# Patient Record
Sex: Female | Born: 1965 | Race: White | Hispanic: No | Marital: Single | Smoking: Never smoker
Health system: Southern US, Community
[De-identification: ages and names within clinical notes are randomized; demographics above are authoritative.]

## PROBLEM LIST (undated history)

## (undated) DIAGNOSIS — K219 Gastro-esophageal reflux disease without esophagitis: Secondary | ICD-10-CM

## (undated) DIAGNOSIS — M1990 Unspecified osteoarthritis, unspecified site: Secondary | ICD-10-CM

## (undated) DIAGNOSIS — Z973 Presence of spectacles and contact lenses: Secondary | ICD-10-CM

## (undated) DIAGNOSIS — R42 Dizziness and giddiness: Secondary | ICD-10-CM

## (undated) DIAGNOSIS — N879 Dysplasia of cervix uteri, unspecified: Secondary | ICD-10-CM

## (undated) DIAGNOSIS — Z889 Allergy status to unspecified drugs, medicaments and biological substances status: Secondary | ICD-10-CM

## (undated) DIAGNOSIS — B019 Varicella without complication: Secondary | ICD-10-CM

## (undated) DIAGNOSIS — M5090 Cervical disc disorder, unspecified, unspecified cervical region: Secondary | ICD-10-CM

## (undated) DIAGNOSIS — H33312 Horseshoe tear of retina without detachment, left eye: Secondary | ICD-10-CM

## (undated) HISTORY — DX: Varicella without complication: B01.9

## (undated) HISTORY — PX: COLONOSCOPY: SHX174

## (undated) HISTORY — PX: LIPOMA EXCISION: SHX5283

## (undated) HISTORY — DX: Allergy status to unspecified drugs, medicaments and biological substances: Z88.9

---

## 1999-02-01 ENCOUNTER — Other Ambulatory Visit: Admission: RE | Admit: 1999-02-01 | Discharge: 1999-02-01 | Payer: Self-pay | Admitting: Obstetrics and Gynecology

## 2000-03-21 ENCOUNTER — Other Ambulatory Visit: Admission: RE | Admit: 2000-03-21 | Discharge: 2000-03-21 | Payer: Self-pay | Admitting: Obstetrics and Gynecology

## 2002-04-20 ENCOUNTER — Other Ambulatory Visit: Admission: RE | Admit: 2002-04-20 | Discharge: 2002-04-20 | Payer: Self-pay

## 2003-04-29 ENCOUNTER — Other Ambulatory Visit: Admission: RE | Admit: 2003-04-29 | Discharge: 2003-04-29 | Payer: Self-pay | Admitting: Obstetrics and Gynecology

## 2004-05-02 ENCOUNTER — Other Ambulatory Visit: Admission: RE | Admit: 2004-05-02 | Discharge: 2004-05-02 | Payer: Self-pay | Admitting: Obstetrics and Gynecology

## 2004-05-09 ENCOUNTER — Encounter: Admission: RE | Admit: 2004-05-09 | Discharge: 2004-05-09 | Payer: Self-pay | Admitting: Obstetrics and Gynecology

## 2005-05-17 ENCOUNTER — Encounter: Admission: RE | Admit: 2005-05-17 | Discharge: 2005-05-17 | Payer: Self-pay | Admitting: Obstetrics and Gynecology

## 2005-05-22 ENCOUNTER — Other Ambulatory Visit: Admission: RE | Admit: 2005-05-22 | Discharge: 2005-05-22 | Payer: Self-pay | Admitting: Obstetrics and Gynecology

## 2005-12-24 ENCOUNTER — Encounter (INDEPENDENT_AMBULATORY_CARE_PROVIDER_SITE_OTHER): Payer: Self-pay | Admitting: Specialist

## 2005-12-24 ENCOUNTER — Ambulatory Visit (HOSPITAL_BASED_OUTPATIENT_CLINIC_OR_DEPARTMENT_OTHER): Admission: RE | Admit: 2005-12-24 | Discharge: 2005-12-24 | Payer: Self-pay | Admitting: General Surgery

## 2006-05-20 ENCOUNTER — Encounter: Admission: RE | Admit: 2006-05-20 | Discharge: 2006-05-20 | Payer: Self-pay | Admitting: Obstetrics and Gynecology

## 2006-05-23 ENCOUNTER — Other Ambulatory Visit: Admission: RE | Admit: 2006-05-23 | Discharge: 2006-05-23 | Payer: Self-pay | Admitting: Obstetrics and Gynecology

## 2007-05-27 ENCOUNTER — Other Ambulatory Visit: Admission: RE | Admit: 2007-05-27 | Discharge: 2007-05-27 | Payer: Self-pay | Admitting: Obstetrics and Gynecology

## 2007-05-27 ENCOUNTER — Encounter: Admission: RE | Admit: 2007-05-27 | Discharge: 2007-05-27 | Payer: Self-pay | Admitting: Obstetrics and Gynecology

## 2007-11-19 ENCOUNTER — Other Ambulatory Visit: Admission: RE | Admit: 2007-11-19 | Discharge: 2007-11-19 | Payer: Self-pay | Admitting: Obstetrics and Gynecology

## 2008-06-07 ENCOUNTER — Encounter: Admission: RE | Admit: 2008-06-07 | Discharge: 2008-06-07 | Payer: Self-pay | Admitting: Obstetrics and Gynecology

## 2008-06-07 ENCOUNTER — Other Ambulatory Visit: Admission: RE | Admit: 2008-06-07 | Discharge: 2008-06-07 | Payer: Self-pay | Admitting: Obstetrics and Gynecology

## 2009-06-08 ENCOUNTER — Other Ambulatory Visit: Admission: RE | Admit: 2009-06-08 | Discharge: 2009-06-08 | Payer: Self-pay | Admitting: Obstetrics and Gynecology

## 2009-06-09 ENCOUNTER — Encounter: Admission: RE | Admit: 2009-06-09 | Discharge: 2009-06-09 | Payer: Self-pay | Admitting: Obstetrics and Gynecology

## 2010-06-12 ENCOUNTER — Encounter: Admission: RE | Admit: 2010-06-12 | Discharge: 2010-06-12 | Payer: Self-pay | Admitting: Obstetrics and Gynecology

## 2010-06-20 ENCOUNTER — Other Ambulatory Visit: Admission: RE | Admit: 2010-06-20 | Discharge: 2010-06-20 | Payer: Self-pay | Admitting: Obstetrics and Gynecology

## 2010-12-08 NOTE — Op Note (Signed)
NAMEJERILYNN, Karen Navarro                ACCOUNT NO.:  [id number]   MEDICAL RECORD NO.:  [id number]          PATIENT TYPE:  AMB   LOCATION:  DSC                          FACILITY:  [facility]   PHYSICIAN:  Gabrielle Dare. Janee Morn, M.D.DATE OF BIRTH:  1966-01-28   DATE OF PROCEDURE:  12/24/2005  DATE OF DISCHARGE:                                 OPERATIVE REPORT   PREOPERATIVE DIAGNOSIS:  Mass right lower back.   POSTOPERATIVE DIAGNOSIS:  Mass right lower back.   PROCEDURE:  Excision mass right lower back.   SURGEON:  Violeta Gelinas.   ANESTHESIA:  General.   HISTORY OF PRESENT ILLNESS:  The patient is a 45 year old female who I  evaluated in the office for a mass in her right lower back.  It has been  gradually getting larger and she presents for elective excision.   PROCEDURE IN DETAIL:  Informed consent was obtained the patient received  intravenous antibiotics.  She was identified in the preop holding area; and  her site was marked where we had agreed upon its location.  It is easily  visible.  She was then brought to the operating room.  Conscious sedation  was administered, her back was prepped and draped in a sterile fashion.  After she was placed in the prone position, the table was flexed to more  easily visualize the mass.  The area was infiltrated with local anesthetic.  Then 1/4% Marcaine with epinephrine mixed with 1% lidocaine.  Once an  adequate local block was obtained, a transverse incision was made overlying  the mass.  The subcutaneous tissues were dissected down revealing the mass.  It was a circular encapsulated mass of adipose tissue; was well  encapsulated, but was quite adherent to the underlying fascia.  It was  widely excised, in 1 piece and sent to pathology.  The wound was copiously  irrigated, meticulous hemostasis was obtained.  Some additional local  anesthetic was injected.  The wound was then closed in layers with deep  tissues approximated with interrupted 3-0  Vicryl sutures; and the skin  closed with a running 4-0 Monocryl subcuticular stitch.  Sponge, needle, and  instrument counts were correct.  Benzoin, Steri-Strips, and sterile  dressings were applied.  The patient tolerated the procedure well without  apparent complication; and was taken to the recovery room in stable  condition.      Gabrielle Dare Janee Morn, M.D.  Electronically Signed     BET/MEDQ  D:  12/24/2005  T:  12/25/2005  Job:  [id number]   cc:   [id number] Elmon Else

## 2011-02-21 ENCOUNTER — Encounter: Payer: Self-pay | Admitting: Podiatry

## 2011-05-07 ENCOUNTER — Other Ambulatory Visit: Payer: Self-pay | Admitting: Obstetrics and Gynecology

## 2011-05-07 DIAGNOSIS — Z1231 Encounter for screening mammogram for malignant neoplasm of breast: Secondary | ICD-10-CM

## 2011-06-19 ENCOUNTER — Ambulatory Visit
Admission: RE | Admit: 2011-06-19 | Discharge: 2011-06-19 | Disposition: A | Discharge status: Home / Self Care | Payer: BC Managed Care – PPO | Source: Ambulatory Visit | Attending: Obstetrics and Gynecology | Admitting: Obstetrics and Gynecology

## 2011-06-19 DIAGNOSIS — Z1231 Encounter for screening mammogram for malignant neoplasm of breast: Secondary | ICD-10-CM

## 2011-06-26 ENCOUNTER — Other Ambulatory Visit (HOSPITAL_COMMUNITY)
Admission: RE | Admit: 2011-06-26 | Discharge: 2011-06-26 | Disposition: A | Discharge status: Home / Self Care | Payer: BC Managed Care – PPO | Source: Ambulatory Visit | Attending: Obstetrics and Gynecology | Admitting: Obstetrics and Gynecology

## 2011-06-26 ENCOUNTER — Other Ambulatory Visit: Payer: Self-pay | Admitting: Obstetrics and Gynecology

## 2011-06-26 DIAGNOSIS — Z01419 Encounter for gynecological examination (general) (routine) without abnormal findings: Secondary | ICD-10-CM | POA: Insufficient documentation

## 2012-05-12 ENCOUNTER — Other Ambulatory Visit: Payer: Self-pay | Admitting: Obstetrics and Gynecology

## 2012-05-12 DIAGNOSIS — Z1231 Encounter for screening mammogram for malignant neoplasm of breast: Secondary | ICD-10-CM

## 2012-06-23 ENCOUNTER — Ambulatory Visit
Admission: RE | Admit: 2012-06-23 | Discharge: 2012-06-23 | Disposition: A | Discharge status: Home / Self Care | Payer: BC Managed Care – PPO | Source: Ambulatory Visit | Attending: Obstetrics and Gynecology | Admitting: Obstetrics and Gynecology

## 2012-06-23 ENCOUNTER — Ambulatory Visit: Payer: BC Managed Care – PPO

## 2012-06-23 DIAGNOSIS — Z1231 Encounter for screening mammogram for malignant neoplasm of breast: Secondary | ICD-10-CM

## 2012-07-09 ENCOUNTER — Other Ambulatory Visit: Payer: Self-pay | Admitting: Obstetrics and Gynecology

## 2012-07-09 ENCOUNTER — Other Ambulatory Visit (HOSPITAL_COMMUNITY)
Admission: RE | Admit: 2012-07-09 | Discharge: 2012-07-09 | Disposition: A | Discharge status: Home / Self Care | Payer: BC Managed Care – PPO | Source: Ambulatory Visit | Attending: Obstetrics and Gynecology | Admitting: Obstetrics and Gynecology

## 2012-07-09 DIAGNOSIS — Z01419 Encounter for gynecological examination (general) (routine) without abnormal findings: Secondary | ICD-10-CM | POA: Insufficient documentation

## 2013-05-18 ENCOUNTER — Other Ambulatory Visit: Payer: Self-pay

## 2013-05-18 DIAGNOSIS — Z1231 Encounter for screening mammogram for malignant neoplasm of breast: Secondary | ICD-10-CM

## 2013-06-24 ENCOUNTER — Ambulatory Visit
Admission: RE | Admit: 2013-06-24 | Discharge: 2013-06-24 | Disposition: A | Discharge status: Home / Self Care | Payer: BC Managed Care – PPO | Source: Ambulatory Visit

## 2013-06-24 DIAGNOSIS — Z1231 Encounter for screening mammogram for malignant neoplasm of breast: Secondary | ICD-10-CM

## 2013-09-10 ENCOUNTER — Other Ambulatory Visit (HOSPITAL_COMMUNITY)
Admission: RE | Admit: 2013-09-10 | Discharge: 2013-09-10 | Disposition: A | Discharge status: Home / Self Care | Payer: BC Managed Care – PPO | Source: Ambulatory Visit | Attending: Obstetrics and Gynecology | Admitting: Obstetrics and Gynecology

## 2013-09-10 ENCOUNTER — Other Ambulatory Visit: Payer: Self-pay | Admitting: Obstetrics and Gynecology

## 2013-09-10 DIAGNOSIS — Z1151 Encounter for screening for human papillomavirus (HPV): Secondary | ICD-10-CM | POA: Insufficient documentation

## 2013-09-10 DIAGNOSIS — Z01419 Encounter for gynecological examination (general) (routine) without abnormal findings: Secondary | ICD-10-CM | POA: Insufficient documentation

## 2013-11-20 ENCOUNTER — Encounter: Payer: Self-pay | Admitting: *Deleted

## 2013-11-20 ENCOUNTER — Ambulatory Visit (INDEPENDENT_AMBULATORY_CARE_PROVIDER_SITE_OTHER): Payer: BC Managed Care – PPO | Admitting: Family Medicine

## 2013-11-20 VITALS — BP 122/74 | HR 85 | Wt 161.0 lb

## 2013-11-20 DIAGNOSIS — M940 Chondrocostal junction syndrome [Tietze]: Secondary | ICD-10-CM

## 2013-11-20 DIAGNOSIS — M542 Cervicalgia: Secondary | ICD-10-CM

## 2013-11-20 DIAGNOSIS — M999 Biomechanical lesion, unspecified: Secondary | ICD-10-CM | POA: Insufficient documentation

## 2013-11-20 DIAGNOSIS — M948X9 Other specified disorders of cartilage, unspecified sites: Secondary | ICD-10-CM

## 2013-11-20 DIAGNOSIS — M9981 Other biomechanical lesions of cervical region: Secondary | ICD-10-CM

## 2013-11-20 MED ORDER — CYCLOBENZAPRINE HCL 5 MG PO TABS: 5.0000 mg | ORAL_TABLET | Freq: Three times a day (TID) | ORAL | Status: DC | PRN

## 2013-11-20 MED ORDER — MELOXICAM 15 MG PO TABS: 15.0000 mg | ORAL_TABLET | Freq: Every day | ORAL | Status: DC

## 2013-11-20 MED ORDER — KETOROLAC TROMETHAMINE 60 MG/2ML IM SOLN
60.0000 mg | Freq: Once | INTRAMUSCULAR | Status: AC
  Administered 2013-11-20: 60 mg via INTRAMUSCULAR

## 2013-11-20 NOTE — Patient Instructions (Signed)
Good to see you Exercises 3 times a week  Work on posture, on wall heels, butt, shoulders and head for goal of 5 min Neck exercises 3 times a week.  Ice 20 minutes Meloxicam daily for 10 days  Flexeril when you need it.  See me again in 2 weeks.

## 2013-11-20 NOTE — Addendum Note (Signed)
Addended by: Edwena FeltyARSON, Makeba Delcastillo T on: 11/20/2013 08:41 AM   Modules accepted: Orders

## 2013-11-20 NOTE — Progress Notes (Signed)
  Tawana ScaleZach Smith D.O.  [facility] [address] [place], [place] [address] Phone: [phone] Subjective:     CC: back and neck pain.   OZH:YQMVHQIONGHPI:Subjective Karen RuedKaren B Smothers is a 48 y.o. female coming in with complaint of neck and upper back pain. Patient has had this pain for multiple decades. Patient states that she's tried anti-inflammatories and over-the-counter medications without any significant improvement. Patient states on day-to-day basis she continues to have pain. Denies any radiation down her arms any numbness or tingling. Patient's daughter sees me for manipulation and states that she would like to potentially try this if it is possible. Patient does not read her injury and states that she wakes up multiple times at night because of neck pain. Denies any weakness in the upper trimmings. Patient is a severity a 6/10.     Past medical history, social, surgical and family history all reviewed in electronic medical record.   Review of Systems: No headache, visual changes, nausea, vomiting, diarrhea, constipation, dizziness, abdominal pain, skin rash, fevers, chills, night sweats, weight loss, swollen lymph nodes, body aches, joint swelling, muscle aches, chest pain, shortness of breath, mood changes.   Objective Blood pressure 122/74, pulse 85, weight 161 lb (73.029 kg), SpO2 99.00%.  General: No apparent distress alert and oriented x3 mood and affect normal, dressed appropriately.  HEENT: Pupils equal, extraocular movements intact  Respiratory: Patient's speak in full sentences and does not appear short of breath  Cardiovascular: No lower extremity edema, non tender, no erythema  Skin: Warm dry intact with no signs of infection or rash on extremities or on axial skeleton.  Abdomen: Soft nontender  Neuro: Cranial nerves II through XII are intact, neurovascularly intact in all extremities with 2+ DTRs and 2+ pulses.  Lymph: No lymphadenopathy of posterior or anterior cervical  chain or axillae bilaterally.  Gait normal with good balance and coordination.  MSK:  Non tender with full range of motion and good stability and symmetric strength and tone of shoulders, elbows, wrist, hip, knee and ankles bilaterally.  Neck: Inspection unremarkable. No palpable stepoffs. Negative Spurling's maneuver. Limited range of motion with left-sided rotation and left-sided side bending Grip strength and sensation normal in bilateral hands Strength good C4 to T1 distribution No sensory change to C4 to T1 Negative Hoffman sign bilaterally Reflexes normal OMT Physical Exam  Standing structural       Occiput left high  Shoulder left high  Inferior angle of scapula  Standing flexion right  Seated Flexion right  Cervical  C2 flexed rotated and side bent left C4 flexed rotated inside that right  Thoracic T1 extended rotated and side bent left with elevated first rib T5 extended rotated and side bent left with elevated fifth rib is stuck in inhalation  Lumbar L1 flexed rotated and side bent to right  Sacrum Right on right      Impression and Recommendations:     This case required medical decision making of moderate complexity.

## 2013-11-20 NOTE — Assessment & Plan Note (Signed)
Decision today to treat with OMT was based on Physical Exam  After verbal consent patient was treated with HVLA, ME, FPR techniques in Cervical, thoracic, lumbar and sacral and rib areas  Patient tolerated the procedure well with improvement in symptoms  Patient given exercises, stretches and lifestyle modifications  See medications in patient instructions if given  Patient will follow up in 2 weeks

## 2013-11-20 NOTE — Assessment & Plan Note (Signed)
Neck pain is likely multifactorial. Patient does not have any radiculopathy and I think this is all musculoskeletal in nature. Patient given home exercises, discussed working position, postural exercises and given medications because patient did have a spasm after osteopathic manipulation. Patient was also given a shot portal secondary to the flare. Patient will come back again in 2 weeks for further evaluation and treatment.

## 2013-12-07 ENCOUNTER — Ambulatory Visit (INDEPENDENT_AMBULATORY_CARE_PROVIDER_SITE_OTHER)
Admission: RE | Admit: 2013-12-07 | Discharge: 2013-12-07 | Disposition: A | Discharge status: Home / Self Care | Payer: BC Managed Care – PPO | Source: Ambulatory Visit | Attending: Family Medicine | Admitting: Family Medicine

## 2013-12-07 ENCOUNTER — Encounter: Payer: Self-pay | Admitting: Family Medicine

## 2013-12-07 ENCOUNTER — Ambulatory Visit (INDEPENDENT_AMBULATORY_CARE_PROVIDER_SITE_OTHER): Payer: BC Managed Care – PPO | Admitting: Family Medicine

## 2013-12-07 VITALS — BP 132/80 | HR 92

## 2013-12-07 DIAGNOSIS — M999 Biomechanical lesion, unspecified: Secondary | ICD-10-CM

## 2013-12-07 DIAGNOSIS — M9981 Other biomechanical lesions of cervical region: Secondary | ICD-10-CM

## 2013-12-07 DIAGNOSIS — M5010 Cervical disc disorder with radiculopathy, unspecified cervical region: Secondary | ICD-10-CM

## 2013-12-07 DIAGNOSIS — M542 Cervicalgia: Secondary | ICD-10-CM

## 2013-12-07 DIAGNOSIS — M5412 Radiculopathy, cervical region: Secondary | ICD-10-CM

## 2013-12-07 MED ORDER — PREDNISONE 50 MG PO TABS: 50.0000 mg | ORAL_TABLET | Freq: Every day | ORAL | Status: DC

## 2013-12-07 MED ORDER — GABAPENTIN 300 MG PO CAPS: 300.0000 mg | ORAL_CAPSULE | Freq: Every day | ORAL | Status: DC

## 2013-12-07 NOTE — Assessment & Plan Note (Addendum)
Patient does have a positive Spurling's and does have radiculopathy going down the left arm. I am concerned the patient actually had severe degenerative disc disease or potential herniated disc mostly at the C4-C5 C5-C6 levels. I would like to get an x-ray today to rule out significant osteophytic changes. Patient was given a 5 day dose of prednisone as well as gabapentin. Discuss decreasing home exercises over the course of the next 10 days. Patient come back in 10 days to discuss further. Spent greater than 25 minutes with patient face-to-face and had greater than 50% of counseling including as described above in assessment and plan.  Patient does give history of shoulder dislocation and states that most of her pain seems to be around her shoulder. Will get x-rays to rule out any underlying osteoarthritic changes.

## 2013-12-07 NOTE — Progress Notes (Signed)
  Karen Navarro D.O. [facility] [address] [place], [place] [address] Phone: [phone]) [phone] Subjective:     CC: back and neck pain follow up  BJY:NWGNFAOZHYHPI:Subjective Karen LeanKaren B Navarro is a 48 y.o. female coming in with complaint of neck and upper back pain. 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 Patient did have manipulation done and given home exercise program. Patient states that she has increase her range of motion and overall is feeling better. Patient though does notice if she moves her neck she does have some pain that seems to radiate down her left arm. She denies any weakness but states that does not doing and has significant prior to evaluation. She denies any fever or chills or any abnormal weight loss.     Past medical history, social, surgical and family history all reviewed in electronic medical record.   Review of Systems: No headache, visual changes, nausea, vomiting, diarrhea, constipation, dizziness, abdominal pain, skin rash, fevers, chills, night sweats, weight loss, swollen lymph nodes, body aches, joint swelling, muscle aches, chest pain, shortness of breath, mood changes.   Objective Blood pressure 132/80, pulse 92, last menstrual period 10/21/2013, SpO2 97.00%.  General: No apparent distress alert and oriented x3 mood and affect normal, dressed appropriately.  HEENT: Pupils equal, extraocular movements intact  Respiratory: Patient's speak in full sentences and does not appear short of breath  Cardiovascular: No lower extremity edema, non tender, no erythema  Skin: Warm dry intact with no signs of infection or rash on extremities or on axial skeleton.  Abdomen: Soft nontender  Neuro: Cranial nerves II through XII are intact, neurovascularly intact in all extremities with 2+ DTRs and 2+ pulses.  Lymph: No lymphadenopathy of posterior or anterior cervical chain or axillae bilaterally.  Gait normal with  good balance and coordination.  MSK:  Non tender with full range of motion and good stability and symmetric strength and tone of shoulders, elbows, wrist, hip, knee and ankles bilaterally.  Neck: Inspection unremarkable. No palpable stepoffs. Positive Spurling's maneuver. Limited range of motion with left-sided rotation and left-sided side bending Grip strength and sensation normal in bilateral hands Strength good C4 to T1 distribution No sensory change to C4 to T1 Negative Hoffman sign bilaterally Reflexes normal OMT Physical Exam  Standing structural       Occiput left high  Shoulder left high  Inferior angle of scapula  Standing flexion right  Seated Flexion right  Cervical  C2 flexed rotated and side bent left C4 flexed rotated inside that right  Thoracic T1 extended rotated and side bent left with elevated first rib T5 extended rotated and side bent left with elevated fifth rib is stuck in inhalation  Lumbar L1 flexed rotated and side bent to right  Sacrum Right on right    Impression and Recommendations:     This case required medical decision making of moderate complexity.

## 2013-12-07 NOTE — Assessment & Plan Note (Addendum)
Is doing better overall. Patient was given a home exercise program and given other exercises to try on a regular basis.  Have a positive Spurling's today which makes me concerned for cervical radiculopathy.  We discussed postural exercises as well but to be beneficial. Patient will come back again in 10 days Established

## 2013-12-07 NOTE — Patient Instructions (Signed)
Good to see you Xrays downstairs today Prednisone daily for 5 days - apologize to your family for me.  Gabapentin at night may make you sleepy Come back in 10 days and tell me how you are doing.

## 2013-12-16 ENCOUNTER — Ambulatory Visit (INDEPENDENT_AMBULATORY_CARE_PROVIDER_SITE_OTHER): Payer: BC Managed Care – PPO | Admitting: Family Medicine

## 2013-12-16 ENCOUNTER — Encounter: Payer: Self-pay | Admitting: Family Medicine

## 2013-12-16 VITALS — BP 132/80 | HR 98 | Ht 65.0 in | Wt 158.0 lb

## 2013-12-16 DIAGNOSIS — M948X9 Other specified disorders of cartilage, unspecified sites: Secondary | ICD-10-CM

## 2013-12-16 DIAGNOSIS — M999 Biomechanical lesion, unspecified: Secondary | ICD-10-CM

## 2013-12-16 DIAGNOSIS — M940 Chondrocostal junction syndrome [Tietze]: Secondary | ICD-10-CM

## 2013-12-16 DIAGNOSIS — M5412 Radiculopathy, cervical region: Secondary | ICD-10-CM

## 2013-12-16 DIAGNOSIS — M5010 Cervical disc disorder with radiculopathy, unspecified cervical region: Secondary | ICD-10-CM

## 2013-12-16 DIAGNOSIS — M9981 Other biomechanical lesions of cervical region: Secondary | ICD-10-CM

## 2013-12-16 NOTE — Assessment & Plan Note (Signed)
Patient does have some radiculopathy. I do think patient's underlying arthritis is likely causing some mild disc protrusion going to the opposite direction causing some nerve root impingement intermittently. The patient given exercises we discussed posture that could be beneficial. Patient does not limited to gabapentin but can take over-the-counter medications as needed and we'll take Flexeril on an as-needed basis. Patient is responding to soft tissue techniques and manipulation as well. Patient come back again in 4 weeks for further evaluation and treatment.  Spent greater than 25 minutes with patient face-to-face and had greater than 50% of counseling including as described above in assessment and plan.

## 2013-12-16 NOTE — Progress Notes (Signed)
  [facility] [address] [place], [place] [address] Phone: [phone] Subjective:     CC: back and neck pain follow up  LAG:TXMIWOEHOZ Karen Navarro is a 48 y.o. female coming in with complaint of neck and upper back pain. 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 Patient has not started her regular exercise program. Patient is wanting to start yoga standing. Patient is not do any activities a regular basis.  Patient also had significant neck pain. Patient states it is still going down her left arm very mildly. Only occur sometimes. Patient states it is very tolerable. Patient is only taking the Flexeril and did not like how to gabapentin made her feel. Patient did take the prednisone with mild improvement. Overall though patient think she is doing somewhat better.  To begin neck x-rays. Patient x-rays were reviewed by me. X-rays taken Dec 07, 2013 shows right-sided C3-C4 neural foraminal hypertrophic and facet degenerative changes.     Past medical history, social, surgical and family history all reviewed in electronic medical record.   Review of Systems: No headache, visual changes, nausea, vomiting, diarrhea, constipation, dizziness, abdominal pain, skin rash, fevers, chills, night sweats, weight loss, swollen lymph nodes, body aches, joint swelling, muscle aches, chest pain, shortness of breath, mood changes.   Objective Weight 158 lb (71.668 kg), last menstrual period 10/21/2013.  General: No apparent distress alert and oriented x3 mood and affect normal, dressed appropriately.  HEENT: Pupils equal, extraocular movements intact  Respiratory: Patient's speak in full sentences and does not appear short of breath  Cardiovascular: No lower extremity edema, non tender, no erythema  Skin: Warm dry intact with no signs of infection or rash on extremities or on axial skeleton.  Abdomen: Soft  nontender  Neuro: Cranial nerves II through XII are intact, neurovascularly intact in all extremities with 2+ DTRs and 2+ pulses.  Lymph: No lymphadenopathy of posterior or anterior cervical chain or axillae bilaterally.  Gait normal with good balance and coordination.  MSK:  Non tender with full range of motion and good stability and symmetric strength and tone of shoulders, elbows, wrist, hip, knee and ankles bilaterally.  Neck: Inspection unremarkable. No palpable stepoffs. Positive Spurling's maneuver. Limited range of motion with left-sided rotation and left-sided side bending Grip strength and sensation normal in bilateral hands Strength good C4 to T1 distribution No sensory change to C4 to T1 Negative Hoffman sign bilaterally Reflexes normal OMT Physical Exam  Standing flexion right  Seated Flexion right  Cervical  C2 flexed rotated and side bent left Significant tightness of the paraspinal musculature bilaterally  Thoracic T1 extended rotated and side bent left with elevated first rib T5 extended rotated and side bent left with elevated fifth rib is stuck in inhalation, same as previous exam  Lumbar L2 flexed rotated and side bent to left  Sacrum Right on right    Impression and Recommendations:     This case required medical decision making of moderate complexity.

## 2013-12-16 NOTE — Patient Instructions (Signed)
Very good to see you Soup cans bend over 45 degrees pull shoulder blades together.  3 sets of 10 most days of the week.  Posture on wall.  Continue the vitamin D 200 IU daily Flexeril at night if needed Tennis ball duct tape together at base of skull on floor.  Other tennis ball to back rib if hurting.  See me again 4 weeks ish

## 2013-12-16 NOTE — Assessment & Plan Note (Signed)
Decision today to treat with OMT was based on Physical Exam  After verbal consent patient was treated with HVLA, ME, FPR techniques in Cervical, thoracic, lumbar and sacral and rib areas  Patient tolerated the procedure well with improvement in symptoms  Patient given exercises, stretches and lifestyle modifications  See medications in patient instructions if given  Patient will follow up in 4 weeks

## 2013-12-16 NOTE — Assessment & Plan Note (Signed)
Patient continues to have slipping riband until patient actually increases her thoracic musculature that she is to continue to have this problem. Patient was given home exercises again and postural exercises to try to do some strengthening. Patient does not want to do formal physical therapy. Patient will attempt to do these exercises she states. Patient will follow up again in 4 weeks for further evaluation.

## 2013-12-17 ENCOUNTER — Ambulatory Visit: Payer: BC Managed Care – PPO | Admitting: Family Medicine

## 2014-05-18 ENCOUNTER — Other Ambulatory Visit: Payer: Self-pay

## 2014-05-18 DIAGNOSIS — Z1231 Encounter for screening mammogram for malignant neoplasm of breast: Secondary | ICD-10-CM

## 2014-06-14 ENCOUNTER — Encounter: Payer: Self-pay | Admitting: Family Medicine

## 2014-06-14 ENCOUNTER — Ambulatory Visit (INDEPENDENT_AMBULATORY_CARE_PROVIDER_SITE_OTHER): Payer: BC Managed Care – PPO | Admitting: Family Medicine

## 2014-06-14 VITALS — BP 128/78 | HR 85 | Ht 65.0 in | Wt 159.0 lb

## 2014-06-14 DIAGNOSIS — M9904 Segmental and somatic dysfunction of sacral region: Secondary | ICD-10-CM

## 2014-06-14 DIAGNOSIS — M9902 Segmental and somatic dysfunction of thoracic region: Secondary | ICD-10-CM

## 2014-06-14 DIAGNOSIS — M999 Biomechanical lesion, unspecified: Secondary | ICD-10-CM

## 2014-06-14 DIAGNOSIS — M940 Chondrocostal junction syndrome [Tietze]: Secondary | ICD-10-CM

## 2014-06-14 DIAGNOSIS — M9901 Segmental and somatic dysfunction of cervical region: Secondary | ICD-10-CM

## 2014-06-14 DIAGNOSIS — M9908 Segmental and somatic dysfunction of rib cage: Secondary | ICD-10-CM

## 2014-06-14 DIAGNOSIS — M5010 Cervical disc disorder with radiculopathy, unspecified cervical region: Secondary | ICD-10-CM

## 2014-06-14 DIAGNOSIS — M9903 Segmental and somatic dysfunction of lumbar region: Secondary | ICD-10-CM

## 2014-06-14 DIAGNOSIS — M542 Cervicalgia: Secondary | ICD-10-CM

## 2014-06-14 NOTE — Assessment & Plan Note (Signed)
Decision today to treat with OMT was based on Physical Exam  After verbal consent patient was treated with HVLA, ME, FPR techniques in Cervical, thoracic, lumbar and sacral and rib areas  Patient tolerated the procedure well with improvement in symptoms  Patient given exercises, stretches and lifestyle modifications  See medications in patient instructions if given  Patient will follow up in 3-4 weeks

## 2014-06-14 NOTE — Patient Instructions (Addendum)
It is good to see you Karen Navarro is your friend after activity.  Heels, butt shoulder and head against wall for goal of 5 minutes daily.  Lots of stress it feels like. Look at following handout this will strengthen upper back.  See me again in 4-6 weeks.   Scapular Winging  with Rehab  Scapular winging syndrome is also known as serratus anterior palsy or long thoracic nerve injury. The condition is an uncommon injury to the nervous system. The condition is caused by injury to the long thoracic nerve that runs through the neck and shoulder. Injury to the shoulder, such as a fall or repetitive stress on the shoulder causes the nerve to become stretched. Occasionally the injury is the result of an infection of the nerve. Damage to the long thoracic nerve results in weakness of the serratus anterior muscle. The serratus anterior muscle is responsible for controlling the shoulder blade (scapula). Weakness in this muscle results in a instability (winging) of the scapula. SYMPTOMS   Pain and weakness in the shoulder (usually the back of the shoulder) that is often diffuse or unable to localize.  Loss of or decrease in shoulder function.  Upper back pain while sitting, due to the scapula pressing on the back of the chair.  Visible deformity in the back of the shoulder. CAUSES  Scapular winging is caused by stretching of the long thoracic nerve. Common mechanisms of injury include:  Viral illness.  Repetitive and/or stressful use of the shoulder.  Falling onto the shoulder with the head and neck stretched away from the shoulder. RISK INCREASES WITH:  Contact sports (football, rugby, lacrosse, or soccer).  Activities involving overhead arm movement (baseball, volleyball, or racquet sports).  Poor strength and flexibility. PREVENTION  Warm up and stretch properly before activity.  Allow for adequate recovery between workouts.  Maintain physical fitness:  Strength, flexibility, and  endurance.  Cardiovascular fitness.  Learn and use proper technique. When possible, have a coach correct improper technique. PROGNOSIS  Scapular winging normally resolves spontaneously within 18 months. In rare circumstances surgery is recommended.  RELATED COMPLICATIONS   Permanent nerve damage, including pain, numbness, tingle, or weakness.  Shoulder weakness.  Recurrent shoulder pain.  Inability to compete in athletics. TREATMENT Treatment initially involves resting from any activities that aggravate your symptoms. The use of ice and medication may help reduce pain and inflammation. The use of strengthening and stretching exercises may help reduce pain with activity, specifically shoulder exercises that improve range of motion. These exercises may be performed at home or with referral to a therapist. If symptoms persist for greater than 6 months despite non-surgical (conservative) treatment, then surgery may be recommended. Surgery is only used for the most serious cases and the purpose is to regain function, not to allow an athlete to return to sports. MEDICATION   If pain medication is necessary, then nonsteroidal anti-inflammatory medications, such as aspirin and ibuprofen, or other minor pain relievers, such as acetaminophen, are often recommended.  Do not take pain medication for 7 days before surgery.  Prescription pain relievers may be given if deemed necessary by your caregiver. Use only as directed and only as much as you need. HEAT AND COLD  Cold treatment (icing) relieves pain and reduces inflammation. Cold treatment should be applied for 10 to 15 minutes every 2 to 3 hours for inflammation and pain and immediately after any activity that aggravates your symptoms. Use ice packs or massage the area with a piece of ice (ice  massage).  Heat treatment may be used prior to performing the stretching and strengthening activities prescribed by your caregiver, physical therapist, or  athletic trainer. Use a heat pack or soak the injury in warm water. SEEK MEDICAL CARE IF:  Treatment seems to offer no benefit, or the condition worsens.  Any medications produce adverse side effects. EXERCISES  RANGE OF MOTION (ROM) AND STRETCHING EXERCISES - Scapular Winging (Serratus Anterior Palsy, Long Thoracic Nerve Injury)  These exercises may help you when beginning to rehabilitate your injury. Your symptoms may resolve with or without further involvement from your physician, physical therapist or athletic trainer. While completing these exercises, remember:   Restoring tissue flexibility helps normal motion to return to the joints. This allows healthier, less painful movement and activity.  An effective stretch should be held for at least 30 seconds.  A stretch should never be painful. You should only feel a gentle lengthening or release in the stretched tissue. ROM - Pendulum  Bend at the waist so that your right / left arm falls away from your body. Support yourself with your opposite hand on a solid surface, such as a table or a countertop.  Your right / left arm should be perpendicular to the ground. If it is not perpendicular, you need to lean over farther. Relax the muscles in your right / left arm and shoulder as much as possible.  Gently sway your hips and trunk so they move your right / left arm without any use of your right / left shoulder muscles.  Progress your movements so that your right / left arm moves side to side, then forward and backward, and finally, both clockwise and counterclockwise.  Complete __________ repetitions in each direction. Many people use this exercise to relieve discomfort in their shoulder as well as to gain range of motion. Repeat __________ times. Complete this exercise __________ times per day. STRETCH - Flexion, Seated   Sit in a firm chair so that your right / left forearm can rest on a table or on a table or countertop. Your right /  left elbow should rest below the height of your shoulder so that your shoulder feels supported and not tense or uncomfortable.  Keeping your right / left shoulder relaxed, lean forward at your waist, allowing your right / left hand to slide forward. Bend forward until you feel a moderate stretch in your shoulder, but before you feel an increase in your pain.  Hold __________ seconds. Slowly return to your starting position. Repeat __________ times. Complete this exercise __________ times per day.  STRETCH - Flexion, Standing  Stand with good posture. With an underhand grip on your right / left and an overhand grip on the opposite hand, grasp a broomstick or cane so that your hands are a little more than shoulder-width apart.  Keeping your right / left elbow straight and shoulder muscles relaxed, push the stick with your opposite hand to raise your right / left arm in front of your body and then overhead. Raise your arm until you feel a stretch in your right / left shoulder, but before you have increased shoulder pain.  Avoid shrugging your right / left shoulder as your arm rises by keeping your shoulder blade tucked down and toward your mid-back spine. Hold __________ seconds.  Slowly return to the starting position. Repeat __________ times. Complete this exercise __________ times per day. STRETCH - Abduction, Supine  Stand with good posture. With an underhand grip on your right /  left and an overhand grip on the opposite hand, grasp a broomstick or cane so that your hands are a little more than shoulder-width apart.  Keeping your right / left elbow straight and shoulder muscles relaxed, push the stick with your opposite hand to raise your right / left arm out to the side of your body and then overhead. Raise your arm until you feel a stretch in your right / left shoulder, but before you have increased shoulder pain.  Avoid shrugging your right / left shoulder as your arm rises by keeping your  shoulder blade tucked down and toward your mid-back spine. Hold __________ seconds.  Slowly return to the starting position. Repeat __________ times. Complete this exercise __________ times per day. ROM - Flexion, Active-Assisted  Lie on your back. You may bend your knees for comfort.  Grasp a broomstick or cane so your hands are about shoulder-width apart. Your right / left hand should grip the end of the stick/cane so that your hand is positioned "thumbs-up," as if you were about to shake hands.  Using your healthy arm to lead, raise your right / left arm overhead until you feel a gentle stretch in your shoulder. Hold __________ seconds.  Use the stick/cane to assist in returning your right / left arm to its starting position. Repeat __________ times. Complete this exercise __________ times per day.  STRENGTHENING EXERCISES - Scapular Winging (Serratus Anterior Palsy, Long Thoracic Nerve Injury) These exercises may help you when beginning to rehabilitate your injury. They may resolve your symptoms with or without further involvement from your physician, physical therapist or athletic trainer. While completing these exercises, remember:   Muscles can gain both the endurance and the strength needed for everyday activities through controlled exercises.  Complete these exercises as instructed by your physician, physical therapist or athletic trainer. Progress with the resistance and repetition exercises only as your caregiver advises.  You may experience muscle soreness or fatigue, but the pain or discomfort you are trying to eliminate should never worsen during these exercises. If this pain does worsen, stop and make certain you are following the directions exactly. If the pain is still present after adjustments, discontinue the exercise until you can discuss the trouble with your clinician.  During your recovery, avoid activity or exercises which involve actions that place your injured hand or  elbow above your head or behind your back or head. These positions stress the tissues which are trying to heal. STRENGTH - Scapular Depression and Adduction   With good posture, sit on a firm chair. Supported your arms in front of you with pillows, arm rests or a table top. Have your elbows in line with the sides of your body.  Gently draw your shoulder blades down and toward your mid-back spine. Gradually increase the tension without tensing the muscles along the top of your shoulders and the back of your neck.  Hold for __________ seconds. Slowly release the tension and relax your muscles completely before completing the next repetition.  After you have practiced this exercise, remove the arm support and complete it in standing as well as sitting. Repeat __________ times. Complete this exercise __________ times per day.  STRENGTH - Scapular Protractors, Standing   Stand arms-length away from a wall. Place your hands on the wall, keeping your elbows straight.  Begin by dropping your shoulder blades down and toward your mid-back spine.  To strengthen your protractors, keep your shoulder blades down, but slide them forward on your  rib cage. It will feel as if you are lifting the back of your rib cage away from the wall. This is a subtle motion and can be challenging to complete. Ask your clinician for further instruction if you are not sure you are doing the exercise correctly.  Hold for __________ seconds. Slowly return to the starting position, resting the muscles completely before completing the next repetition. Repeat __________ times. Complete this exercise __________ times per day. STRENGTH - Scapular Protractors, Supine  Lie on your back on a firm surface. Extend your right / left arm straight into the air while holding a __________ weight in your hand.  Keeping your head and back in place, lift your shoulder off the floor.  Hold __________ seconds. Slowly return to the starting  position and allow your muscles to relax completely before completing the next repetition. Repeat __________ times. Complete this exercise __________ times per day. STRENGTH - Scapular Protractors, Quadruped  Get onto your hands and knees with your shoulders directly over your hands (or as close as you comfortably can be).  Keeping your elbows locked, lift the back of your rib cage up into your shoulder blades so your mid-back rounds-out. Keep your neck muscles relaxed.  Hold this position for __________ seconds. Slowly return to the starting position and allow your muscles to relax completely before completing the next repetition. Repeat __________ times. Complete this exercise __________ times per day.  STRENGTH - Scapular Depressors  Keeping your feet on the floor, lift your bottom from the seat and lock your elbows.  Keeping your elbows straight, allow gravity to pull your body weight down. Your shoulders will rise toward your ears.  Raise your body against gravity by drawing your shoulder blades down your back, shortening the distance between your shoulders and ears. Although your feet should always maintain contact with the floor, your feet should progressively support less body weight as you get stronger.  Hold __________ seconds. In a controlled and slow manner, lower your body weight to begin the next repetition. Repeat __________ times. Complete this exercise __________ times per day.  STRENGTH - Shoulder Extensors, Prone  Lie on your stomach on a firm surface so that your right / left arm overhangs the edge. Rest your forehead on your opposite forearm. With your thumb facing away from your body and your elbow straight, hold a __________ weight in your hand.  Squeeze your right / left shoulder blade to your mid-back spine and then slowly raise your arm behind you to the height of the bed.  Hold for __________ seconds. Slowly reverse the directions and return to the starting  position, controlling the weight as you lower your arm. Repeat __________ times. Complete this exercise __________ times per day.  STRENGTH - Horizontal Abductors Choose one of the two oppositions to complete this exercise. Prone: lying on stomach:  Lie on your stomach on a firm surface so that your right / left arm overhangs the edge. Rest your forehead on your opposite forearm. With your palm facing the floor and your elbow straight, hold a __________ weight in your hand.  Squeeze your right / left shoulder blade to your mid-back spine and then slowly raise your arm to the height of the bed.  Hold for __________ seconds. Slowly reverse the directions and return to the starting position, controlling the weight as you lower your arm. Repeat __________ times. Complete this exercise __________ times per day. Standing:  Secure a rubber exercise band/tubing so that it  is at the height of your shoulders when you are either standing or sitting on a firm arm-less chair.  Grasp an end of the band/tubing in each hand and have your palms face each other. Straighten your elbows and lift your hands straight in front of you at shoulder height. Step back away from the secured end of band/tubing until it becomes tense.  Squeeze your shoulder blades together. Keeping your elbows locked and your hands at shoulder-height, bring your hands out to your side.  Hold __________ seconds. Slowly ease the tension on the band/tubing as you reverse the directions and return to the starting position. Repeat __________ times. Complete this exercise __________ times per day. STRENGTH - Scapular Retractors  Secure a rubber exercise band/tubing so that it is at the height of your shoulders when you are either standing or sitting on a firm arm-less chair.  With a palm-down grip, grasp an end of the band/tubing in each hand. Straighten your elbows and lift your hands straight in front of you at shoulder height. Step back  away from the secured end of band/tubing until it becomes tense.  Squeezing your shoulder blades together, draw your elbows back as you bend them. Keep your upper arm lifted away from your body throughout the exercise.  Hold __________ seconds. Slowly ease the tension on the band/tubing as you reverse the directions and return to the starting position. Repeat __________ times. Complete this exercise __________ times per day. STRENGTH - Shoulder Extensors   Secure a rubber exercise band/tubing so that it is at the height of your shoulders when you are either standing or sitting on a firm arm-less chair.  With a thumbs-up grip, grasp an end of the band/tubing in each hand. Straighten your elbows and lift your hands straight in front of you at shoulder height. Step back away from the secured end of band/tubing until it becomes tense.  Squeezing your shoulder blades together, pull your hands down to the sides of your thighs. Do not allow your hands to go behind you.  Hold for __________ seconds. Slowly ease the tension on the band/tubing as you reverse the directions and return to the starting position. Repeat __________ times. Complete this exercise __________ times per day.  STRENGTH - Scapular Retractors and External Rotators  Secure a rubber exercise band/tubing so that it is at the height of your shoulders when you are either standing or sitting on a firm arm-less chair.  With a palm-down grip, grasp an end of the band/tubing in each hand. Bend your elbows 90 degrees and lift your elbows to shoulder height at your sides. Step back away from the secured end of band/tubing until it becomes tense.  Squeezing your shoulder blades together, rotate your shoulder so that your upper arm and elbow remain stationary, but your fists travel upward to head-height.  Hold __________ for seconds. Slowly ease the tension on the band/tubing as you reverse the directions and return to the starting  position. Repeat __________ times. Complete this exercise __________ times per day.  STRENGTH - Scapular Retractors and External Rotators, Rowing  Secure a rubber exercise band/tubing so that it is at the height of your shoulders when you are either standing or sitting on a firm arm-less chair.  With a palm-down grip, grasp an end of the band/tubing in each hand. Straighten your elbows and lift your hands straight in front of you at shoulder height. Step back away from the secured end of band/tubing until it becomes tense.  Step 1: Squeeze your shoulder blades together. Bending your elbows, draw your hands to your chest as if you are rowing a boat. At the end of this motion, your hands and elbow should be at shoulder-height and your elbows should be out to your sides.  Step 2: Rotate your shoulder to raise your hands above your head. Your forearms should be vertical and your upper-arms should be horizontal.  Hold for __________ seconds. Slowly ease the tension on the band/tubing as you reverse the directions and return to the starting position. Repeat __________ times. Complete this exercise __________ times per day.  STRENGTH - Scapular Retractors and Elevators  Secure a rubber exercise band/tubing so that it is at the height of your shoulders when you are either standing or sitting on a firm arm-less chair.  With a thumbs-up grip, grasp an end of the band/tubing in each hand. Step back away from the secured end of band/tubing until it becomes tense.  Squeezing your shoulder blades together, straighten your elbows and lift your hands straight over your head.  Hold for __________ seconds. Slowly ease the tension on the band/tubing as you reverse the directions and return to the starting position. Repeat __________ times. Complete this exercise __________ times per day.  Document Released: 07/09/2005 Document Revised: 10/01/2011 Document Reviewed: 10/21/2008 [facility] Patient Information  2015 [place], [place]. This information is not intended to replace advice given to you by your health care provider. Make sure you discuss any questions you have with your health care provider.

## 2014-06-14 NOTE — Assessment & Plan Note (Signed)
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 This is not affecting her daily activities and she is able to sleep comfortably. Discuss the gabapentin may be beneficial. Discussed the possibility of further evaluation with an MRI of the radicular symptoms or any numbness or weakness becomes constant on the side I would encourage MRI. Patient states at this time it is not stopping her from any activities and seems to be mostly positional. Still responding to manipulation. Patient and will follow-up with me again in 3-4 weeks for further evaluation and treatment.

## 2014-06-14 NOTE — Progress Notes (Signed)
  Tawana ScaleZach Ciera Beckum D.O. [facility] [address] [place], [place] [address] Phone: [phone] Subjective:     CC: back and neck pain follow up  WGN:[id number]:Subjective Karen LeanKaren B Navarro is a 48 y.o. female coming in with complaint of neck and upper back pain. 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  Patient was having difficulty being motivated before last visit to do the exercises regularly. Patient states overall she has been doing relatively well but continues to have some mild back pain with the slipped rib. Patient is also stating that the left side with radicular symptoms down the arm is continuing to occur sometimes.  Patient also had neck pain with radicular symptoms. Patient is no longer taking the gabapentin and has not used the Flexeril and a long time. States that with the increasing stress of the holidays she has noticed some mild increasing and muscle spasm.   neck x-rays previous.  X-rays taken Dec 07, 2013 shows right-sided C3-C4 neural foraminal hypertrophic and facet degenerative changes.     Past medical history, social, surgical and family history all reviewed in electronic medical record.   Review of Systems: No headache, visual changes, nausea, vomiting, diarrhea, constipation, dizziness, abdominal pain, skin rash, fevers, chills, night sweats, weight loss, swollen lymph nodes, body aches, joint swelling, muscle aches, chest pain, shortness of breath, mood changes.   Objective Blood pressure 128/78, pulse 85, height 5\' 5"  (1.651 m), weight 159 lb (72.122 kg), SpO2 97 %.  General: No apparent distress alert and oriented x3 mood and affect normal, dressed appropriately.  HEENT: Pupils equal, extraocular movements intact  Respiratory: Patient's speak in full sentences and does not appear short of breath  Cardiovascular: No lower extremity edema, non tender, no erythema  Skin: Warm dry intact with no signs  of infection or rash on extremities or on axial skeleton.  Abdomen: Soft nontender  Neuro: Cranial nerves II through XII are intact, neurovascularly intact in all extremities with 2+ DTRs and 2+ pulses.  Lymph: No lymphadenopathy of posterior or anterior cervical chain or axillae bilaterally.  Gait normal with good balance and coordination.  MSK:  Non tender with full range of motion and good stability and symmetric strength and tone of shoulders, elbows, wrist, hip, knee and ankles bilaterally.  Neck: Inspection unremarkable. No palpable stepoffs. Positive Spurling's maneuver with C5 corresponding nerve root impingement on the left side. Limited range of motion with left-sided rotation and left-sided side bending Grip strength and sensation normal in bilateral hands Strength good C4 to T1 distribution No sensory change to C4 to T1 Negative Hoffman sign bilaterally Reflexes normal OMT Physical Exam  Standing flexion right  Seated Flexion right  Cervical  C2 flexed rotated and side bent left Significant tightness of the paraspinal musculature bilaterally  Thoracic T1 extended rotated and side bent left with elevated first rib T5 extended rotated and side bent left with elevated fifth rib is stuck in inhalation  Lumbar L2 flexed rotated and side bent to left  Sacrum Right on right    Impression and Recommendations:     This case required medical decision making of moderate complexity.

## 2014-06-14 NOTE — Assessment & Plan Note (Signed)
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 Patient was given more strengthening exercises. Patient was given the option of starting formal physical therapy which patient declined. We discussed icing protocol. Patient did respond well to the conservative therapy and the osteopathic manipulation.. Encourage her to do the exercises on a more regular basis. Patient will follow-up in 3-4 weeks for further evaluation and treatment.

## 2014-06-28 ENCOUNTER — Ambulatory Visit
Admission: RE | Admit: 2014-06-28 | Discharge: 2014-06-28 | Disposition: A | Discharge status: Home / Self Care | Payer: BC Managed Care – PPO | Source: Ambulatory Visit

## 2014-06-28 DIAGNOSIS — Z1231 Encounter for screening mammogram for malignant neoplasm of breast: Secondary | ICD-10-CM

## 2014-06-30 ENCOUNTER — Other Ambulatory Visit: Payer: Self-pay | Admitting: Dermatology

## 2014-09-10 ENCOUNTER — Other Ambulatory Visit (HOSPITAL_COMMUNITY)
Admission: RE | Admit: 2014-09-10 | Discharge: 2014-09-10 | Disposition: A | Discharge status: Home / Self Care | Payer: BLUE CROSS/BLUE SHIELD | Source: Ambulatory Visit | Attending: Obstetrics and Gynecology | Admitting: Obstetrics and Gynecology

## 2014-09-10 ENCOUNTER — Other Ambulatory Visit: Payer: Self-pay | Admitting: Obstetrics and Gynecology

## 2014-09-10 DIAGNOSIS — Z01419 Encounter for gynecological examination (general) (routine) without abnormal findings: Secondary | ICD-10-CM | POA: Diagnosis not present

## 2014-09-13 LAB — CYTOLOGY - PAP

## 2015-05-24 ENCOUNTER — Other Ambulatory Visit: Payer: Self-pay

## 2015-05-24 DIAGNOSIS — Z1231 Encounter for screening mammogram for malignant neoplasm of breast: Secondary | ICD-10-CM

## 2015-07-04 ENCOUNTER — Ambulatory Visit
Admission: RE | Admit: 2015-07-04 | Discharge: 2015-07-04 | Disposition: A | Discharge status: Home / Self Care | Payer: BLUE CROSS/BLUE SHIELD | Source: Ambulatory Visit

## 2015-07-04 DIAGNOSIS — Z1231 Encounter for screening mammogram for malignant neoplasm of breast: Secondary | ICD-10-CM

## 2015-09-28 ENCOUNTER — Encounter: Payer: Self-pay | Admitting: Family Medicine

## 2015-09-28 ENCOUNTER — Ambulatory Visit (INDEPENDENT_AMBULATORY_CARE_PROVIDER_SITE_OTHER): Payer: BLUE CROSS/BLUE SHIELD | Admitting: Family Medicine

## 2015-09-28 VITALS — BP 118/76 | HR 89 | Ht 65.0 in | Wt 161.0 lb

## 2015-09-28 DIAGNOSIS — M9902 Segmental and somatic dysfunction of thoracic region: Secondary | ICD-10-CM | POA: Diagnosis not present

## 2015-09-28 DIAGNOSIS — M940 Chondrocostal junction syndrome [Tietze]: Secondary | ICD-10-CM

## 2015-09-28 DIAGNOSIS — M9901 Segmental and somatic dysfunction of cervical region: Secondary | ICD-10-CM

## 2015-09-28 DIAGNOSIS — M9908 Segmental and somatic dysfunction of rib cage: Secondary | ICD-10-CM

## 2015-09-28 DIAGNOSIS — M5010 Cervical disc disorder with radiculopathy, unspecified cervical region: Secondary | ICD-10-CM

## 2015-09-28 DIAGNOSIS — M9904 Segmental and somatic dysfunction of sacral region: Secondary | ICD-10-CM | POA: Diagnosis not present

## 2015-09-28 DIAGNOSIS — M999 Biomechanical lesion, unspecified: Secondary | ICD-10-CM

## 2015-09-28 NOTE — Assessment & Plan Note (Signed)
Decision today to treat with OMT was based on Physical Exam  After verbal consent patient was treated with HVLA, ME, FPR techniques in Cervical, thoracic, lumbar and sacral and rib areas  Patient tolerated the procedure well with improvement in symptoms  Patient given exercises, stretches and lifestyle modifications  See medications in patient instructions if given  Patient will follow up in 2-3 weeks

## 2015-09-28 NOTE — Patient Instructions (Signed)
Great to see you  Ice still can help Watch the posture at work.  On wall with heels, butt shoulder and head touching for a goal of 5 minutes daily  Prilosec 2 times daily for next 2 weeks.   I think it can help Continue to work on the posture See me again in 2-3 weeks. If not a lot better we may need to consider gabapentin

## 2015-09-28 NOTE — Progress Notes (Signed)
Pre visit review using our clinic review tool, if applicable. No additional management support is needed unless otherwise documented below in the visit note. 

## 2015-09-28 NOTE — Assessment & Plan Note (Signed)
Still does have some radicular symptoms on the left side. We discussed the gabapentin which patient does not want to do a regular basis. Patient is no longer taking meloxicam either. Patient does not want any prescribed medications at this time. We will continue to monitor. If any worsening symptoms I would encourage her to have all medications it seemed to be helping previously. Possibly advance imaging would be warranted if the radicular symptoms seem to worsen or become constant.

## 2015-09-28 NOTE — Assessment & Plan Note (Signed)
Patient continues to have some mild sunburn syndrome. I do think that there should be some underlying gastroesophageal reflux disease that could be contributing as well. Patient will try over-the-counter medications. Encourage or numbness. We discussed icing regimen. Patient will try to work on ergonomics and greater detail as well as postural exercises. Patient declined formal physical therapy. We'll see her back in 2-3 weeks for further evaluation and treatment.

## 2015-09-28 NOTE — Progress Notes (Signed)
  Karen Navarro D.O. [facility] [address] [place], [place] [address] Phone: [phone] Subjective:     CC: back and neck pain follow up  QIO:[id number]:Subjective [name] is a 50 y.o. female coming in with complaint of neck and upper back pain. 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   Patient is having considerable amount of pain again. It has been since [facility] she has been seen. Was responding well to osteopathic manipulation. States that she is having more time. Having some mild increase in anxiety recently. Patient has not been doing the exercises or working on the ergonomics as much as she had been previously. Looking to start treatment again. Continues to have some of the radiation down the left arm.   neck x-rays previous.  X-rays taken Dec 07, 2013 shows right-sided C3-C4 neural foraminal hypertrophic and facet degenerative changes.     Past medical history, social, surgical and family history all reviewed in electronic medical record.   Review of Systems: No headache, visual changes, nausea, vomiting, diarrhea, constipation, dizziness, abdominal pain, skin rash, fevers, chills, night sweats, weight loss, swollen lymph nodes, body aches, joint swelling, muscle aches, chest pain, shortness of breath, mood changes.   Objective Blood pressure 118/76, pulse 89, height 5\' 5"  (1.651 m), weight 161 lb (73.029 kg), SpO2 98 %.  General: No apparent distress alert and oriented x3 mood and affect normal, dressed appropriately.  HEENT: Pupils equal, extraocular movements intact  Respiratory: Patient's speak in full sentences and does not appear short of breath  Cardiovascular: No lower extremity edema, non tender, no erythema  Skin: Warm dry intact with no signs of infection or rash on extremities or on axial skeleton.  Abdomen: Soft nontender  Neuro: Cranial nerves II through XII are intact, neurovascularly  intact in all extremities with 2+ DTRs and 2+ pulses.  Lymph: No lymphadenopathy of posterior or anterior cervical chain or axillae bilaterally.  Gait normal with good balance and coordination.  MSK:  Non tender with full range of motion and good stability and symmetric strength and tone of shoulders, elbows, wrist, hip, knee and ankles bilaterally.  Neck: Inspection unremarkable. No palpable stepoffs. Positive Spurling's maneuver with C5-7 corresponding nerve root impingement on the left side.  Decreased range of motion compared to previous exam limiting left-sided rotation and side bending Grip strength and sensation normal in bilateral hands Strength good C4 to T1 distribution No sensory change to C4 to T1 Negative Hoffman sign bilaterally Reflexes normal  OMT Physical Exam  Standing flexion right  Seated Flexion right  Cervical  C2 flexed rotated and side bent left  C7 flexed rotated and side bent left Significant tightness of the paraspinal musculature bilaterally  Thoracic T1 extended rotated and side bent left with elevated first rib T5 extended rotated and side bent left with elevated fifth rib is stuck in inhalation  Lumbar L2 flexed rotated and side bent to left  L4 flexed rotated inside that right  Sacrum Right on right    Impression and Recommendations:     This case required medical decision making of moderate complexity.

## 2015-10-03 ENCOUNTER — Other Ambulatory Visit (HOSPITAL_COMMUNITY)
Admission: RE | Admit: 2015-10-03 | Discharge: 2015-10-03 | Disposition: A | Discharge status: Home / Self Care | Payer: BLUE CROSS/BLUE SHIELD | Source: Ambulatory Visit | Attending: Obstetrics and Gynecology | Admitting: Obstetrics and Gynecology

## 2015-10-03 ENCOUNTER — Other Ambulatory Visit: Payer: Self-pay | Admitting: Obstetrics and Gynecology

## 2015-10-03 DIAGNOSIS — Z01411 Encounter for gynecological examination (general) (routine) with abnormal findings: Secondary | ICD-10-CM | POA: Diagnosis present

## 2015-10-04 LAB — CYTOLOGY - PAP

## 2015-10-18 ENCOUNTER — Ambulatory Visit (INDEPENDENT_AMBULATORY_CARE_PROVIDER_SITE_OTHER): Payer: BLUE CROSS/BLUE SHIELD | Admitting: Family Medicine

## 2015-10-18 ENCOUNTER — Encounter: Payer: Self-pay | Admitting: Family Medicine

## 2015-10-18 VITALS — BP 122/62 | HR 90 | Ht 65.0 in | Wt 161.0 lb

## 2015-10-18 DIAGNOSIS — M5010 Cervical disc disorder with radiculopathy, unspecified cervical region: Secondary | ICD-10-CM | POA: Diagnosis not present

## 2015-10-18 DIAGNOSIS — M9903 Segmental and somatic dysfunction of lumbar region: Secondary | ICD-10-CM | POA: Diagnosis not present

## 2015-10-18 DIAGNOSIS — M9902 Segmental and somatic dysfunction of thoracic region: Secondary | ICD-10-CM

## 2015-10-18 DIAGNOSIS — M940 Chondrocostal junction syndrome [Tietze]: Secondary | ICD-10-CM | POA: Diagnosis not present

## 2015-10-18 DIAGNOSIS — M9901 Segmental and somatic dysfunction of cervical region: Secondary | ICD-10-CM | POA: Diagnosis not present

## 2015-10-18 DIAGNOSIS — M9904 Segmental and somatic dysfunction of sacral region: Secondary | ICD-10-CM

## 2015-10-18 DIAGNOSIS — M999 Biomechanical lesion, unspecified: Secondary | ICD-10-CM

## 2015-10-18 MED ORDER — GABAPENTIN 100 MG PO CAPS: 200.0000 mg | ORAL_CAPSULE | Freq: Every day | ORAL | Status: DC

## 2015-10-18 NOTE — Assessment & Plan Note (Signed)
Concerning the patient's underlying problems is being cervical degenerative disc disease. Patient is going to be put on gabapentin. Prescribed today. Encourage her to possibly to formal physical therapy which patient declined. She will work better on the postural regular basis. We discussed icing regimen. Patient will then come back and see me again in 4-6 weeks for further evaluation and treatment. Does respond fairly well to osteopathic manipulation.

## 2015-10-18 NOTE — Patient Instructions (Signed)
Good to se eyou  Ice is your friend Continue the vitamins Posture is key, keep the shoulders back.  Gabapentin 200mg  at night See me again in 4-6 weeks.

## 2015-10-18 NOTE — Progress Notes (Signed)
  Karen Navarro D.O. [facility] [address] [place], [place] [address] Phone: [phone] Subjective:     CC: back and neck pain follow up  ION:[id number]:Subjective Karen LeanKaren B Navarro is a 50 y.o. female coming in with complaint of neck and upper back pain. 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    Patient was having worsening symptoms of the neck and was found to have significant foraminal and facet arthritis at C3-C4. Patient continues to have the neck pain. Describes it as a dull, throbbing aching pain. Some radiation to the mid back. Sometimes to the arm but very minimal. No weakness or numbness. Continues intermittently take the vitamins.    neck x-rays previous.  X-rays taken Dec 07, 2013 shows right-sided C3-C4 neural foraminal hypertrophic and facet degenerative changes.     Past Medical History  Diagnosis Date  . Chicken pox   . Multiple allergies    No past surgical history on file. Social History  Substance Use Topics  . Smoking status: Never Smoker   . Smokeless tobacco: Never Used  . Alcohol Use: Yes   Allergies  Allergen Reactions  . Sulfa Antibiotics    No family history on file. No family history of rheumatological diseases   Past medical history, social, surgical and family history all reviewed in electronic medical record.   Review of Systems: No headache, visual changes, nausea, vomiting, diarrhea, constipation, dizziness, abdominal pain, skin rash, fevers, chills, night sweats, weight loss, swollen lymph nodes, body aches, joint swelling, muscle aches, chest pain, shortness of breath, mood changes.   Objective Blood pressure 122/62, pulse 90, height 5\' 5"  (1.651 m), weight 161 lb (73.029 kg), SpO2 98 %.  General: No apparent distress alert and oriented x3 mood and affect normal, dressed appropriately.  HEENT: Pupils equal, extraocular movements intact  Respiratory: Patient's speak in  full sentences and does not appear short of breath  Cardiovascular: No lower extremity edema, non tender, no erythema  Skin: Warm dry intact with no signs of infection or rash on extremities or on axial skeleton.  Abdomen: Soft nontender  Neuro: Cranial nerves II through XII are intact, neurovascularly intact in all extremities with 2+ DTRs and 2+ pulses.  Lymph: No lymphadenopathy of posterior or anterior cervical chain or axillae bilaterally.  Gait normal with good balance and coordination.  MSK:  Non tender with full range of motion and good stability and symmetric strength and tone of shoulders, elbows, wrist, hip, knee and ankles bilaterally.  Neck: Inspection unremarkable. No palpable stepoffs. Positive Spurling's maneuver Noted.  Into needed limited range of motion in rotation and side bending bilaterally Grip strength and sensation normal in bilateral hands Strength good C4 to T1 distribution No sensory change to C4 to T1 Negative Hoffman sign bilaterally Reflexes normal Mild worsening completed his exam  OMT Physical Exam   Cervical  C2 flexed rotated and side bent left  C7 flexed rotated and side bent left Significant tightness of the paraspinal musculature bilaterally  Thoracic T1 extended rotated and side bent left with elevated first rib T5 extended rotated and side bent left   Lumbar L2 flexed rotated and side bent to left  L4 flexed rotated inside that right  Sacrum Right on right    Impression and Recommendations:     This case required medical decision making of moderate complexity.

## 2015-10-18 NOTE — Assessment & Plan Note (Signed)
Continues to have more of a slipping rib on the T1 on the left today. The first fifth rib seemed to be intact. Encourage patient to do more of the scapular stabilization and we discussed sleeping position.

## 2015-10-18 NOTE — Assessment & Plan Note (Signed)
Decision today to treat with OMT was based on Physical Exam  After verbal consent patient was treated with HVLA, ME, FPR techniques in Cervical, thoracic, lumbar and sacral and rib areas  Patient tolerated the procedure well with improvement in symptoms  Patient given exercises, stretches and lifestyle modifications  See medications in patient instructions if given  Patient will follow up in 4 weeks    

## 2015-10-18 NOTE — Progress Notes (Signed)
Pre visit review using our clinic review tool, if applicable. No additional management support is needed unless otherwise documented below in the visit note. 

## 2015-12-01 ENCOUNTER — Ambulatory Visit (INDEPENDENT_AMBULATORY_CARE_PROVIDER_SITE_OTHER): Payer: BLUE CROSS/BLUE SHIELD | Admitting: Family Medicine

## 2015-12-01 ENCOUNTER — Encounter: Payer: Self-pay | Admitting: Family Medicine

## 2015-12-01 VITALS — BP 112/78 | HR 77 | Ht 65.0 in | Wt 157.0 lb

## 2015-12-01 DIAGNOSIS — M9904 Segmental and somatic dysfunction of sacral region: Secondary | ICD-10-CM

## 2015-12-01 DIAGNOSIS — M9903 Segmental and somatic dysfunction of lumbar region: Secondary | ICD-10-CM | POA: Diagnosis not present

## 2015-12-01 DIAGNOSIS — M9902 Segmental and somatic dysfunction of thoracic region: Secondary | ICD-10-CM

## 2015-12-01 DIAGNOSIS — M9908 Segmental and somatic dysfunction of rib cage: Secondary | ICD-10-CM

## 2015-12-01 DIAGNOSIS — M5010 Cervical disc disorder with radiculopathy, unspecified cervical region: Secondary | ICD-10-CM

## 2015-12-01 DIAGNOSIS — M9901 Segmental and somatic dysfunction of cervical region: Secondary | ICD-10-CM

## 2015-12-01 DIAGNOSIS — M940 Chondrocostal junction syndrome [Tietze]: Secondary | ICD-10-CM

## 2015-12-01 DIAGNOSIS — M999 Biomechanical lesion, unspecified: Secondary | ICD-10-CM

## 2015-12-01 NOTE — Assessment & Plan Note (Signed)
Seems stable at this time. Is responding to gabapentin well. We discussed continuing home exercises in the icing pedicle. Patient and will come back and see me again in 7-8 weeks for further evaluation and treatment.

## 2015-12-01 NOTE — Progress Notes (Signed)
  Tawana ScaleZach Smith D.O. [facility] [address] [place], [place] [address] Phone: [phone]([phone] Subjective:     CC: back and neck pain follow up  ZHY:[id number]:Subjective Karen LeanKaren B Navarro is a 50 y.o. female coming in with complaint of neck and upper back pain. 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    Patient at last visit was having worsening neck pain. Patient states since she is been taking the gabapentin she's been feeling significant better. Sleeping much better. States that the radicular symptoms that she was having intermittently has almost completely resolved. Still some mild tightness of the rib on the left side but otherwise feeling much better.   neck x-rays previous.  X-rays taken Dec 07, 2013 shows right-sided C3-C4 neural foraminal hypertrophic and facet degenerative changes.     Past Medical History  Diagnosis Date  . Chicken pox   . Multiple allergies    No past surgical history on file. Social History  Substance Use Topics  . Smoking status: Never Smoker   . Smokeless tobacco: Never Used  . Alcohol Use: Yes   Allergies  Allergen Reactions  . Sulfa Antibiotics    No family history on file. No family history of rheumatological diseases   Past medical history, social, surgical and family history all reviewed in electronic medical record.   Review of Systems: No headache, visual changes, nausea, vomiting, diarrhea, constipation, dizziness, abdominal pain, skin rash, fevers, chills, night sweats, weight loss, swollen lymph nodes, body aches, joint swelling, muscle aches, chest pain, shortness of breath, mood changes.   Objective Blood pressure 112/78, pulse 77, height 5\' 5"  (1.651 m), weight 157 lb (71.215 kg), SpO2 98 %.  General: No apparent distress alert and oriented x3 mood and affect normal, dressed appropriately.  HEENT: Pupils equal, extraocular movements intact  Respiratory: Patient's  speak in full sentences and does not appear short of breath  Cardiovascular: No lower extremity edema, non tender, no erythema  Skin: Warm dry intact with no signs of infection or rash on extremities or on axial skeleton.  Abdomen: Soft nontender  Neuro: Cranial nerves II through XII are intact, neurovascularly intact in all extremities with 2+ DTRs and 2+ pulses.  Lymph: No lymphadenopathy of posterior or anterior cervical chain or axillae bilaterally.  Gait normal with good balance and coordination.  MSK:  Non tender with full range of motion and good stability and symmetric strength and tone of shoulders, elbows, wrist, hip, knee and ankles bilaterally.  Neck: Inspection unremarkable. No palpable stepoffs. Positive Spurling's maneuver Noted.  Into needed limited range of motion in rotation and side bending bilaterally Grip strength and sensation normal in bilateral hands Strength good C4 to T1 distribution No sensory change to C4 to T1 Negative Hoffman sign bilaterally Reflexes normal Mild worsening completed his exam  OMT Physical Exam   Cervical  C2 flexed rotated and side bent left  C7 flexed rotated and side bent left Significant tightness of the paraspinal musculature bilaterally  Thoracic T1 extended rotated and side bent left with elevated first rib T3 extended rotated and side bent left   Lumbar L2 flexed rotated and side bent to left L4 flexed rotated inside that right  Sacrum Right on right    Impression and Recommendations:     This case required medical decision making of moderate complexity.

## 2015-12-01 NOTE — Patient Instructions (Signed)
Great to see you  Ice will always be good.  Lets continue the gabapentin  See me again 7-8 weeks

## 2015-12-01 NOTE — Progress Notes (Signed)
Pre visit review using our clinic review tool, if applicable. No additional management support is needed unless otherwise documented below in the visit note. 

## 2015-12-01 NOTE — Assessment & Plan Note (Signed)
Decision today to treat with OMT was based on Physical Exam  After verbal consent patient was treated with HVLA, ME, FPR techniques in Cervical, thoracic, lumbar and sacral and rib areas  Patient tolerated the procedure well with improvement in symptoms  Patient given exercises, stretches and lifestyle modifications  See medications in patient instructions if given  Patient will follow up in 7-8 weeks

## 2015-12-01 NOTE — Assessment & Plan Note (Signed)
Patient continued to have the same problem. Did respond well to knee in the back maneuver. Follow-up in 7-8 weeks encourage posture and ergonomics are up-to-date

## 2016-01-26 ENCOUNTER — Encounter: Payer: Self-pay | Admitting: Family Medicine

## 2016-01-26 ENCOUNTER — Ambulatory Visit (INDEPENDENT_AMBULATORY_CARE_PROVIDER_SITE_OTHER): Payer: BLUE CROSS/BLUE SHIELD | Admitting: Family Medicine

## 2016-01-26 VITALS — BP 122/72 | HR 83 | Ht 65.0 in | Wt 156.0 lb

## 2016-01-26 DIAGNOSIS — M9902 Segmental and somatic dysfunction of thoracic region: Secondary | ICD-10-CM

## 2016-01-26 DIAGNOSIS — M9908 Segmental and somatic dysfunction of rib cage: Secondary | ICD-10-CM

## 2016-01-26 DIAGNOSIS — M9901 Segmental and somatic dysfunction of cervical region: Secondary | ICD-10-CM

## 2016-01-26 DIAGNOSIS — M5010 Cervical disc disorder with radiculopathy, unspecified cervical region: Secondary | ICD-10-CM | POA: Diagnosis not present

## 2016-01-26 DIAGNOSIS — M940 Chondrocostal junction syndrome [Tietze]: Secondary | ICD-10-CM

## 2016-01-26 DIAGNOSIS — M999 Biomechanical lesion, unspecified: Secondary | ICD-10-CM

## 2016-01-26 DIAGNOSIS — M9904 Segmental and somatic dysfunction of sacral region: Secondary | ICD-10-CM

## 2016-01-26 DIAGNOSIS — M9903 Segmental and somatic dysfunction of lumbar region: Secondary | ICD-10-CM

## 2016-01-26 NOTE — Assessment & Plan Note (Signed)
Decision today to treat with OMT was based on Physical Exam  After verbal consent patient was treated with HVLA, ME, FPR techniques in Cervical, thoracic, lumbar and sacral and rib areas  Patient tolerated the procedure well with improvement in symptoms  Patient given exercises, stretches and lifestyle modifications  See medications in patient instructions if given  Patient will follow up in 6-8 weeks

## 2016-01-26 NOTE — Progress Notes (Signed)
Karen ScaleZach Navarro D.O. [facility] [address] [place], [place] [address] Phone: [phone]([phone] Subjective:     CC: back and neck pain follow up  NUU:[id number]:Subjective Karen LeanKaren B Navarro is a 50 y.o. female coming in with complaint of neck and upper back pain. 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    Patient seems to be doing relatively well. Feels that her rigidus slipped over the last 2 weeks. Patient has got to do the exercises more regularly and she continues to forget to do so. When she does do the exercises she does feel better. Feels that the gabapentin has been beneficial for more neck pain. He is using occasionally and not every night. Patient denies any side effects but is sleeping better with the medication when she takes it. Denies any radiation down the arms or any numbness or tingling.   neck x-rays previous.  X-rays taken Dec 07, 2013 shows right-sided C3-C4 neural foraminal hypertrophic and facet degenerative changes.     Past Medical History  Diagnosis Date  . Chicken pox   . Multiple allergies    No past surgical history on file. Social History  Substance Use Topics  . Smoking status: Never Smoker   . Smokeless tobacco: Never Used  . Alcohol Use: Yes   Allergies  Allergen Reactions  . Sulfa Antibiotics    No family history on file. No family history of rheumatological diseases   Past medical history, social, surgical and family history all reviewed in electronic medical record.   Review of Systems: No headache, visual changes, nausea, vomiting, diarrhea, constipation, dizziness, abdominal pain, skin rash, fevers, chills, night sweats, weight loss, swollen lymph nodes, body aches, joint swelling, muscle aches, chest pain, shortness of breath, mood changes.   Objective Blood pressure 122/72, pulse 83, height 5\' 5"  (1.651 m), weight 156 lb (70.761 kg), SpO2 98 %.  General: No apparent distress  alert and oriented x3 mood and affect normal, dressed appropriately.  HEENT: Pupils equal, extraocular movements intact  Respiratory: Patient's speak in full sentences and does not appear short of breath  Cardiovascular: No lower extremity edema, non tender, no erythema  Skin: Warm dry intact with no signs of infection or rash on extremities or on axial skeleton.  Abdomen: Soft nontender  Neuro: Cranial nerves II through XII are intact, neurovascularly intact in all extremities with 2+ DTRs and 2+ pulses.  Lymph: No lymphadenopathy of posterior or anterior cervical chain or axillae bilaterally.  Gait normal with good balance and coordination.  MSK:  Non tender with full range of motion and good stability and symmetric strength and tone of shoulders, elbows, wrist, hip, knee and ankles bilaterally.  Neck: Inspection unremarkable. No palpable stepoffs. Positive Spurling's maneuver still noted with some radicular symptoms. Very mild limitation in range of motion side bending bilaterally Grip strength and sensation normal in bilateral hands Strength good C4 to T1 distribution No sensory change to C4 to T1 Negative Hoffman sign bilaterally Reflexes normal No significant improvement and if anything some mild worsening  OMT Physical Exam   Cervical  C2 flexed rotated and side bent left  C7 flexed rotated and side bent left  Thoracic T1 extended rotated and side bent left with elevated first rib T5 extended rotated and side bent left   Lumbar L2 flexed rotated and side bent to left L4 flexed rotated inside that right  Sacrum Right on right    Impression  and Recommendations:     This case required medical decision making of moderate complexity.

## 2016-01-26 NOTE — Assessment & Plan Note (Signed)
Patient is to consider on posture and ergonomics are of the day.

## 2016-01-26 NOTE — Progress Notes (Signed)
Pre visit review using our clinic review tool, if applicable. No additional management support is needed unless otherwise documented below in the visit note. 

## 2016-01-26 NOTE — Assessment & Plan Note (Signed)
Discussed with patient again at great length. We discussed that we can consider different treatment options as well as advance imaging with home long this is been going on. Patient continues to have some radicular symptoms but no weakness or numbness. Discussed with patient at great length and patient was to continue with conservative therapy. Patient will remain active. We will not make any significant changes in medications. Discussed ergonomics as well as postural training that can be beneficial. Patient will come back and see me again in 6 weeks.

## 2016-01-26 NOTE — Patient Instructions (Signed)
Good to see you  On wall with heels, butt shoulder and head touching for a goal of 5 minutes daily  Stay active.  Avoid heavy lifting with one arm Vitamin D 2000IU daily  DHEA 50 mg daily for 4 weeks See me again in 6-8 weeks.

## 2016-02-26 ENCOUNTER — Other Ambulatory Visit: Payer: Self-pay | Admitting: Family Medicine

## 2016-02-27 NOTE — Telephone Encounter (Signed)
Refill done.  

## 2016-03-15 ENCOUNTER — Ambulatory Visit (INDEPENDENT_AMBULATORY_CARE_PROVIDER_SITE_OTHER): Payer: BLUE CROSS/BLUE SHIELD | Admitting: Family Medicine

## 2016-03-15 ENCOUNTER — Encounter: Payer: Self-pay | Admitting: Family Medicine

## 2016-03-15 VITALS — BP 118/70 | HR 83 | Wt 157.0 lb

## 2016-03-15 DIAGNOSIS — M9908 Segmental and somatic dysfunction of rib cage: Secondary | ICD-10-CM

## 2016-03-15 DIAGNOSIS — M9904 Segmental and somatic dysfunction of sacral region: Secondary | ICD-10-CM

## 2016-03-15 DIAGNOSIS — M9902 Segmental and somatic dysfunction of thoracic region: Secondary | ICD-10-CM

## 2016-03-15 DIAGNOSIS — M9903 Segmental and somatic dysfunction of lumbar region: Secondary | ICD-10-CM | POA: Diagnosis not present

## 2016-03-15 DIAGNOSIS — M999 Biomechanical lesion, unspecified: Secondary | ICD-10-CM

## 2016-03-15 DIAGNOSIS — M940 Chondrocostal junction syndrome [Tietze]: Secondary | ICD-10-CM

## 2016-03-15 NOTE — Assessment & Plan Note (Signed)
Decision today to treat with OMT was based on Physical Exam  After verbal consent patient was treated with HVLA, ME, FPR techniques in Cervical, thoracic, lumbar and sacral and rib areas  Patient tolerated the procedure well with improvement in symptoms  Patient given exercises, stretches and lifestyle modifications  See medications in patient instructions if given  Patient will follow up in 8-10 weeks

## 2016-03-15 NOTE — Progress Notes (Signed)
  Tawana ScaleZach Smith D.O. [facility] [address] [place], [place] [address] Phone: [phone](336) [phone] Subjective:     CC: back and neck pain follow up  NUU:[id number]:Subjective  [name] is a 50 y.o. female coming in with complaint of neck and upper back pain. 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    Patient has been doing relatively well. Some mild tightness of the neck but nothing significant. Patient has been feeling good overall. Not doing the exercises regularly. Patient is considering started yoga.   neck x-rays previous.  X-rays taken Dec 07, 2013 shows right-sided C3-C4 neural foraminal hypertrophic and facet degenerative changes.     Past Medical History:  Diagnosis Date  . Chicken pox   . Multiple allergies    No past surgical history on file. Social History  Substance Use Topics  . Smoking status: Never Smoker  . Smokeless tobacco: Never Used  . Alcohol use Yes   Allergies  Allergen Reactions  . Sulfa Antibiotics    No family history on file. No family history of rheumatological diseases   Past medical history, social, surgical and family history all reviewed in electronic medical record.   Review of Systems: No headache, visual changes, nausea, vomiting, diarrhea, constipation, dizziness, abdominal pain, skin rash, fevers, chills, night sweats, weight loss, swollen lymph nodes, body aches, joint swelling, muscle aches, chest pain, shortness of breath, mood changes.   Objective  Blood pressure 118/70, pulse 83, weight 157 lb (71.2 kg), SpO2 98 %.  General: No apparent distress alert and oriented x3 mood and affect normal, dressed appropriately.  HEENT: Pupils equal, extraocular movements intact  Respiratory: Patient's speak in full sentences and does not appear short of breath  Cardiovascular: No lower extremity edema, non tender, no erythema  Skin: Warm dry intact with no signs of infection or  rash on extremities or on axial skeleton.  Abdomen: Soft nontender  Neuro: Cranial nerves II through XII are intact, neurovascularly intact in all extremities with 2+ DTRs and 2+ pulses.  Lymph: No lymphadenopathy of posterior or anterior cervical chain or axillae bilaterally.  Gait normal with good balance and coordination.  MSK:  Non tender with full range of motion and good stability and symmetric strength and tone of shoulders, elbows, wrist, hip, knee and ankles bilaterally.  Neck: Inspection unremarkable. No palpable stepoffs. Data Spurling's today which is an improvement Mild increase in range of motion. Still tightness no noted. Grip strength and sensation normal in bilateral hands Strength good C4 to T1 distribution No sensory change to C4 to T1 Negative Hoffman sign bilaterally Reflexes normal Improvement noted  OMT Physical Exam   Cervical  C2 flexed rotated and side bent left  C6 flexed rotated and side bent left  Thoracic T1 extended rotated and side bent left with elevated first rib  Lumbar L2 flexed rotated and side bent to left L4 flexed rotated inside that right  Sacrum Right on right    Impression and Recommendations:     This case required medical decision making of moderate complexity.

## 2016-03-15 NOTE — Patient Instructions (Signed)
God luck with yoga Ice when you need it Remember the tennis ball trick See me again in 8-10 weeks.

## 2016-03-15 NOTE — Assessment & Plan Note (Signed)
Continues to give her difficulty on the left side. We discussed again about core strengthening. We discussed home exercise. Patient will continue to stay active. We will see her back again in 8-10 weeks.

## 2016-05-23 NOTE — Progress Notes (Signed)
  Tawana ScaleZach Smith D.O. [facility] [address] [place], [place] [address] Phone: [phone] Subjective:     CC: back and neck pain follow up  BJY:NWGNFAOZHYHPI:Subjective  Karen LeanKaren B Navarro is a 50 y.o. female coming in with complaint of neck and upper back pain. 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    Increasing tightness at this time. States that it seems to be more on the left side of her upper back and then the right side of her lower back. Denies any significant radiation. Mild in her mid and radiation to the left hand but no weakness. States that the right side seems to be more uncomfortable but would not consider a pain of the neck. Patient states since last exam has been worsening.   neck x-rays previous.  X-rays taken Dec 07, 2013 shows right-sided C3-C4 neural foraminal hypertrophic and facet degenerative changes.     Past Medical History:  Diagnosis Date  . Chicken pox   . Multiple allergies    No past surgical history on file. Social History  Substance Use Topics  . Smoking status: Never Smoker  . Smokeless tobacco: Never Used  . Alcohol use Yes   Allergies  Allergen Reactions  . Sulfa Antibiotics    No family history on file. No family history of rheumatological diseases   Past medical history, social, surgical and family history all reviewed in electronic medical record.   Review of Systems: No headache, visual changes, nausea, vomiting, diarrhea, constipation, dizziness, abdominal pain, skin rash, fevers, chills, night sweats, weight loss, swollen lymph nodes, body aches, joint swelling, muscle aches, chest pain, shortness of breath, mood changes.   Objective  There were no vitals taken for this visit.  Systems examined below as of 05/23/16 General: NAD A&O x3 mood, affect normal  HEENT: Pupils equal, extraocular movements intact no nystagmus Respiratory: not short of breath at rest or with  speaking Cardiovascular: No lower extremity edema, non tender Skin: Warm dry intact with no signs of infection or rash on extremities or on axial skeleton. Abdomen: Soft nontender, no masses Neuro: Cranial nerves  intact, neurovascularly intact in all extremities with 2+ DTRs and 2+ pulses. Lymph: No lymphadenopathy appreciated today  Gait normal with good balance and coordination.  MSK: Non tender with full range of motion and good stability and symmetric strength and tone of shoulders, elbows, wrist,  knee hips and ankles bilaterally.   Neck: Inspection unremarkable. No palpable stepoffs. Positive Spurling again on the right side I cannot last 5 of extension as well as rotation to the right Grip strength and sensation normal in bilateral hands Strength good C4 to T1 distribution No sensory change to C4 to T1 Negative Hoffman sign bilaterally Reflexes normal Worsening from previous exam  OMT Physical Exam   Cervical  C2 flexed rotated and side bent left  C5 flexed rotated and side bent left  Thoracic T3 extended rotated and side bent left with inhaled third rib T5 extended rotated and side bent right  Lumbar L2 flexed rotated and side bent to left L5 flexed rotated inside that right  Sacrum Right on right    Impression and Recommendations:     This case required medical decision making of moderate complexity.

## 2016-05-24 ENCOUNTER — Ambulatory Visit (INDEPENDENT_AMBULATORY_CARE_PROVIDER_SITE_OTHER): Payer: BLUE CROSS/BLUE SHIELD | Admitting: Family Medicine

## 2016-05-24 ENCOUNTER — Encounter: Payer: Self-pay | Admitting: Family Medicine

## 2016-05-24 VITALS — BP 120/78 | HR 70 | Ht 65.0 in | Wt 157.0 lb

## 2016-05-24 DIAGNOSIS — M999 Biomechanical lesion, unspecified: Secondary | ICD-10-CM | POA: Diagnosis not present

## 2016-05-24 DIAGNOSIS — M5010 Cervical disc disorder with radiculopathy, unspecified cervical region: Secondary | ICD-10-CM | POA: Diagnosis not present

## 2016-05-24 DIAGNOSIS — M940 Chondrocostal junction syndrome [Tietze]: Secondary | ICD-10-CM | POA: Diagnosis not present

## 2016-05-24 NOTE — Assessment & Plan Note (Signed)
Decision today to treat with OMT was based on Physical Exam  After verbal consent patient was treated with HVLA, ME, FPR techniques in Cervical, thoracic, lumbar and sacral and rib areas  Patient tolerated the procedure well with improvement in symptoms  Patient given exercises, stretches and lifestyle modifications  See medications in patient instructions if given  Patient will follow up in 3 weeks

## 2016-05-24 NOTE — Assessment & Plan Note (Signed)
Discussed posture.

## 2016-05-24 NOTE — Assessment & Plan Note (Signed)
Mild exacerbation at this time. We discussed icing regimen and home exercises. We discussed which activities to do in which ones to avoid. We discussed ergonomics. States increase gabapentin to 300 mg. Continue all other medications. Short course of anti-inflammatories. Follow-up again in 3 weeks.

## 2016-05-24 NOTE — Patient Instructions (Signed)
Good to see you  Duexis 3 times a day for 3 days Gabapentin 300mg  at night for a week then back to 200mg   See me again in 3 weeks

## 2016-06-04 ENCOUNTER — Other Ambulatory Visit: Payer: Self-pay | Admitting: Obstetrics and Gynecology

## 2016-06-04 DIAGNOSIS — L821 Other seborrheic keratosis: Secondary | ICD-10-CM | POA: Diagnosis not present

## 2016-06-04 DIAGNOSIS — L814 Other melanin hyperpigmentation: Secondary | ICD-10-CM | POA: Diagnosis not present

## 2016-06-04 DIAGNOSIS — D1801 Hemangioma of skin and subcutaneous tissue: Secondary | ICD-10-CM | POA: Diagnosis not present

## 2016-06-04 DIAGNOSIS — Z1231 Encounter for screening mammogram for malignant neoplasm of breast: Secondary | ICD-10-CM

## 2016-06-04 DIAGNOSIS — L719 Rosacea, unspecified: Secondary | ICD-10-CM | POA: Diagnosis not present

## 2016-06-11 ENCOUNTER — Ambulatory Visit: Payer: BLUE CROSS/BLUE SHIELD | Admitting: Family Medicine

## 2016-06-20 ENCOUNTER — Ambulatory Visit: Payer: BLUE CROSS/BLUE SHIELD | Admitting: Family Medicine

## 2016-07-11 ENCOUNTER — Ambulatory Visit
Admission: RE | Admit: 2016-07-11 | Discharge: 2016-07-11 | Disposition: A | Discharge status: Home / Self Care | Payer: BLUE CROSS/BLUE SHIELD | Source: Ambulatory Visit | Attending: Obstetrics and Gynecology | Admitting: Obstetrics and Gynecology

## 2016-07-11 DIAGNOSIS — Z1231 Encounter for screening mammogram for malignant neoplasm of breast: Secondary | ICD-10-CM | POA: Diagnosis not present

## 2016-08-07 ENCOUNTER — Ambulatory Visit (INDEPENDENT_AMBULATORY_CARE_PROVIDER_SITE_OTHER): Payer: BLUE CROSS/BLUE SHIELD | Admitting: Family Medicine

## 2016-08-07 ENCOUNTER — Encounter: Payer: Self-pay | Admitting: Family Medicine

## 2016-08-07 VITALS — BP 122/84 | HR 74 | Ht 65.0 in | Wt 159.6 lb

## 2016-08-07 DIAGNOSIS — M5010 Cervical disc disorder with radiculopathy, unspecified cervical region: Secondary | ICD-10-CM

## 2016-08-07 DIAGNOSIS — M999 Biomechanical lesion, unspecified: Secondary | ICD-10-CM | POA: Diagnosis not present

## 2016-08-07 MED ORDER — GABAPENTIN 100 MG PO CAPS: 200.0000 mg | ORAL_CAPSULE | Freq: Every day | ORAL | 5 refills | Status: DC

## 2016-08-07 NOTE — Patient Instructions (Addendum)
You are doing great  Ice is your friend Continue the gabapentin  See me again in 6-8 weeks Try changing position every 2 hours at work if you can from sitting to standing.  See you soon!

## 2016-08-07 NOTE — Assessment & Plan Note (Signed)
Patient is having no radicular symptoms at this time. Seems to be stable overall. We discussed strengthening upper back and ergonomics are out today. Discussed transitioning to a standing desk to be beneficial. We discussed icing regimen. She'll come back and see me again in 6-8 weeks for further evaluation and treatment. Continue gabapentin and same dose

## 2016-08-07 NOTE — Progress Notes (Signed)
  Tawana ScaleZach Arlon Bleier D.O. [facility] [address] [place], [place] [address] Phone: [phone]) [phone] Subjective:     CC: back and neck pain follow up  OZH:[id number]:Subjective  [name] is a 51 y.o. female coming in with complaint of neck and upper back pain. 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    Patient states overall she has been doing very well. Some increasing tightness recently. Patient states that it just feels the same. Mild increase in stress. 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 Does have a transitioning desk but does not seem to do as much as she should. Minimal radiation down the arm. Gabapentin has been helping.   neck x-rays previous.  X-rays taken Dec 07, 2013 shows right-sided C3-C4 neural foraminal hypertrophic and facet degenerative changes.     Past Medical History:  Diagnosis Date  . Chicken pox   . Multiple allergies    No past surgical history on file. Social History  Substance Use Topics  . Smoking status: Never Smoker  . Smokeless tobacco: Never Used  . Alcohol use Yes   Allergies  Allergen Reactions  . Sulfa Antibiotics    No family history on file. No family history of rheumatological diseases   Past medical history, social, surgical and family history all reviewed in electronic medical record.   Review of Systems: No headache, visual changes, nausea, vomiting, diarrhea, constipation, dizziness, abdominal pain, skin rash, fevers, chills, night sweats, weight loss, swollen lymph nodes, body aches, joint swelling, muscle aches, chest pain, shortness of breath, mood changes.    Objective  Blood pressure 122/84, pulse 74, height 5\' 5"  (1.651 m), weight 159 lb 9.6 oz (72.4 kg).  Systems examined below as of 08/07/16 General: NAD A&O x3 mood, affect normal  HEENT: Pupils equal, extraocular movements intact no nystagmus Respiratory: not short of  breath at rest or with speaking Cardiovascular: No lower extremity edema, non tender Skin: Warm dry intact with no signs of infection or rash on extremities or on axial skeleton. Abdomen: Soft nontender, no masses Neuro: Cranial nerves  intact, neurovascularly intact in all extremities with 2+ DTRs and 2+ pulses. Lymph: No lymphadenopathy appreciated today  Gait normal with good balance and coordination.  MSK: Non tender with full range of motion and good stability and symmetric strength and tone of shoulders, elbows, wrist,  knee hips and ankles bilaterally.    Neck: Inspection unremarkable. No palpable stepoffs. Negative Spurling's maneuver. Limitation lacking the last 10 of left-sided rotation and 5 of right-sided rotation. Minimal sidebending bilaterally. Grip strength and sensation normal in bilateral hands Strength good C4 to T1 distribution No sensory change to C4 to T1 Negative Hoffman sign bilaterally Reflexes normal Mild improvement from previous exam.  Osteopathic findings Cervical C2 flexed rotated and side bent right C4 flexed rotated and side bent left C6 flexed rotated and side bent left T3 extended rotated and side bent right inhaled third rib T9 extended rotated and side bent left L2 flexed rotated and side bent right Sacrum right on right  Right on right    Impression and Recommendations:     This case required medical decision making of moderate complexity.

## 2016-08-07 NOTE — Assessment & Plan Note (Signed)
Decision today to treat with OMT was based on Physical Exam  After verbal consent patient was treated with HVLA, ME, FPR techniques in Cervical, thoracic, lumbar and sacral and rib areas  Patient tolerated the procedure well with improvement in symptoms  Patient given exercises, stretches and lifestyle modifications  See medications in patient instructions if given  Patient will follow up in 6-8 weeks              

## 2016-08-25 ENCOUNTER — Other Ambulatory Visit: Payer: Self-pay | Admitting: Family Medicine

## 2016-08-27 NOTE — Telephone Encounter (Signed)
Refill done.  

## 2016-10-10 ENCOUNTER — Other Ambulatory Visit: Payer: Self-pay | Admitting: Obstetrics and Gynecology

## 2016-10-10 ENCOUNTER — Other Ambulatory Visit (HOSPITAL_COMMUNITY)
Admission: RE | Admit: 2016-10-10 | Discharge: 2016-10-10 | Disposition: A | Discharge status: Home / Self Care | Payer: BLUE CROSS/BLUE SHIELD | Source: Ambulatory Visit | Attending: Obstetrics and Gynecology | Admitting: Obstetrics and Gynecology

## 2016-10-10 DIAGNOSIS — Z1151 Encounter for screening for human papillomavirus (HPV): Secondary | ICD-10-CM | POA: Diagnosis not present

## 2016-10-10 DIAGNOSIS — Z01419 Encounter for gynecological examination (general) (routine) without abnormal findings: Secondary | ICD-10-CM | POA: Insufficient documentation

## 2016-10-11 ENCOUNTER — Other Ambulatory Visit: Payer: Self-pay | Admitting: Obstetrics and Gynecology

## 2016-10-11 DIAGNOSIS — N6320 Unspecified lump in the left breast, unspecified quadrant: Secondary | ICD-10-CM

## 2016-10-12 ENCOUNTER — Other Ambulatory Visit: Payer: Self-pay | Admitting: Obstetrics and Gynecology

## 2016-10-12 ENCOUNTER — Inpatient Hospital Stay: Admission: RE | Admit: 2016-10-12 | Payer: BLUE CROSS/BLUE SHIELD | Source: Ambulatory Visit

## 2016-10-12 ENCOUNTER — Ambulatory Visit
Admission: RE | Admit: 2016-10-12 | Discharge: 2016-10-12 | Disposition: A | Discharge status: Home / Self Care | Payer: BLUE CROSS/BLUE SHIELD | Source: Ambulatory Visit | Attending: Obstetrics and Gynecology | Admitting: Obstetrics and Gynecology

## 2016-10-12 DIAGNOSIS — N6320 Unspecified lump in the left breast, unspecified quadrant: Secondary | ICD-10-CM

## 2016-10-12 DIAGNOSIS — N6489 Other specified disorders of breast: Secondary | ICD-10-CM | POA: Diagnosis not present

## 2016-10-12 DIAGNOSIS — R922 Inconclusive mammogram: Secondary | ICD-10-CM | POA: Diagnosis not present

## 2016-10-12 LAB — CYTOLOGY - PAP
Diagnosis: NEGATIVE
HPV: NOT DETECTED

## 2016-10-15 ENCOUNTER — Encounter: Payer: Self-pay | Admitting: Family Medicine

## 2016-10-15 ENCOUNTER — Ambulatory Visit (INDEPENDENT_AMBULATORY_CARE_PROVIDER_SITE_OTHER): Payer: BLUE CROSS/BLUE SHIELD | Admitting: Family Medicine

## 2016-10-15 VITALS — BP 104/68 | HR 78 | Ht 65.0 in | Wt 158.2 lb

## 2016-10-15 DIAGNOSIS — M5010 Cervical disc disorder with radiculopathy, unspecified cervical region: Secondary | ICD-10-CM | POA: Diagnosis not present

## 2016-10-15 DIAGNOSIS — M999 Biomechanical lesion, unspecified: Secondary | ICD-10-CM | POA: Diagnosis not present

## 2016-10-15 NOTE — Assessment & Plan Note (Signed)
Decision today to treat with OMT was based on Physical Exam  After verbal consent patient was treated with HVLA, ME, FPR techniques in cervical, thoracic, rib lumbar and sacral areas  Patient tolerated the procedure well with improvement in symptoms  Patient given exercises, stretches and lifestyle modifications  See medications in patient instructions if given  Patient will follow up in 4-6 weeks 

## 2016-10-15 NOTE — Patient Instructions (Signed)
Posture, posture posture Try to change position every 2 hours.  Ice is your friend.  See me again in 6-8 weeks.

## 2016-10-15 NOTE — Progress Notes (Signed)
  Tawana ScaleZach Mikal Blasdell D.O. [facility] [address] [place], [place] [address] Phone: [phone] Subjective:     CC: back and neck pain follow up  BJY:NWGNFAOZHYHPI:Subjective  Werner LeanKaren B Smothers is a 51 y.o. female coming in with complaint of neck and upper back pain. 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    Patient was to be taking gabapentin which patient is not taking on a regular basis. Patient has been trying to do some icing this very intermittently. Not using a standing desk at work. Being relatively noncompliant. Increasing discomfort recently. More pain on the left sign.   neck x-rays previous.  X-rays taken Dec 07, 2013 shows right-sided C3-C4 neural foraminal hypertrophic and facet degenerative changes.     Past Medical History:  Diagnosis Date  . Chicken pox   . Multiple allergies    No past surgical history on file. Social History  Substance Use Topics  . Smoking status: Never Smoker  . Smokeless tobacco: Never Used  . Alcohol use Yes   Allergies  Allergen Reactions  . Sulfa Antibiotics    No family history on file. No family history of rheumatological diseases   Past medical history, social, surgical and family history all reviewed in electronic medical record.   Review of Systems: No headache, visual changes, nausea, vomiting, diarrhea, constipation, dizziness, abdominal pain, skin rash, fevers, chills, night sweats, weight loss, swollen lymph nodes, body aches, joint swelling, muscle aches, chest pain, shortness of breath, mood changes.    Objective  Blood pressure 104/68, pulse 78, height 5\' 5"  (1.651 m), weight 158 lb 3.2 oz (71.8 kg), last menstrual period 09/21/2016, SpO2 96 %.  Systems examined below as of 10/15/16 General: NAD A&O x3 mood, affect normal  HEENT: Pupils equal, extraocular movements intact no nystagmus Respiratory: not short of breath at rest or with speaking Cardiovascular: No  lower extremity edema, non tender Skin: Warm dry intact with no signs of infection or rash on extremities or on axial skeleton. Abdomen: Soft nontender, no masses Neuro: Cranial nerves  intact, neurovascularly intact in all extremities with 2+ DTRs and 2+ pulses. Lymph: No lymphadenopathy appreciated today  Gait normal with good balance and coordination.  MSK: Non tender with full range of motion and good stability and symmetric strength and tone of shoulders, elbows, wrist,  knee hips and ankles bilaterally.    Neck: Inspection unremarkable. No palpable stepoffs. Negative Spurling's maneuver. Increasing tenderness especially on the left sign. y. Grip strength and sensation normal in bilateral hands Strength good C4 to T1 distribution No sensory change to C4 to T1 Negative Hoffman sign bilaterally Reflexes normal Patient actually has some increasing tightness from previous exam  Osteopathic findings Cervical C2 flexed rotated and side bent right C7 flexed rotated and side bent left T3 extended rotated and side bent right inhaled third rib T9 extended rotated and side bent left L3 flexed rotated and side bent right Sacrum right on right     Impression and Recommendations:     This case required medical decision making of moderate complexity.

## 2016-10-15 NOTE — Assessment & Plan Note (Signed)
Doing a little worse.  More tight.  Gabapentin. Encouraged,  rtc in 4-6 weeks

## 2016-11-26 ENCOUNTER — Ambulatory Visit: Payer: BLUE CROSS/BLUE SHIELD | Admitting: Family Medicine

## 2017-02-18 ENCOUNTER — Encounter: Payer: Self-pay | Admitting: Family Medicine

## 2017-02-18 ENCOUNTER — Ambulatory Visit (INDEPENDENT_AMBULATORY_CARE_PROVIDER_SITE_OTHER): Payer: BLUE CROSS/BLUE SHIELD | Admitting: Family Medicine

## 2017-02-18 VITALS — BP 120/80 | HR 87 | Ht 65.0 in | Wt 155.0 lb

## 2017-02-18 DIAGNOSIS — M940 Chondrocostal junction syndrome [Tietze]: Secondary | ICD-10-CM | POA: Diagnosis not present

## 2017-02-18 DIAGNOSIS — M999 Biomechanical lesion, unspecified: Secondary | ICD-10-CM | POA: Diagnosis not present

## 2017-02-18 NOTE — Assessment & Plan Note (Signed)
Continues to have difficult he. We'll need to continue to work on posture. I do think it is also somewhat anxiety provoking. Has had some anxiety with daughter self recently. Patient has not started to increase activity slowly. Follow-up with me again in 6-12 weeks

## 2017-02-18 NOTE — Assessment & Plan Note (Signed)
Decision today to treat with OMT was based on Physical Exam  After verbal consent patient was treated with HVLA, ME, FPR techniques in cervical, thoracic, rib lumbar and sacral areas  Patient tolerated the procedure well with improvement in symptoms  Patient given exercises, stretches and lifestyle modifications  See medications in patient instructions if given  Patient will follow up in 6-12 weeks 

## 2017-02-18 NOTE — Progress Notes (Signed)
  Tawana ScaleZach Benz Vandenberghe D.O. [facility] [address] [place], [place] [address] Phone: [phone]([phone] Subjective:     CC: back and neck pain follow up  BJY:NWGNFAOZHYHPI:Subjective  Karen LeanKaren B Navarro is a 51 y.o. female coming in with complaint of neck and upper back pain. 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    Patient was to be taking gabapentin which patient is not taking on a regular basis. Patient has been trying to do some icing this very intermittently. Not using a standing desk at work. Increasing tightness noted again. Been greater than 3 months since we seen her. Not doing the exercises regularly. Continues to gabapentin regularly   neck x-rays previous.  X-rays taken Dec 07, 2013 shows right-sided C3-C4 neural foraminal hypertrophic and facet degenerative changes.     Past Medical History:  Diagnosis Date  . Chicken pox   . Multiple allergies    No past surgical history on file. Social History  Substance Use Topics  . Smoking status: Never Smoker  . Smokeless tobacco: Never Used  . Alcohol use Yes   Allergies  Allergen Reactions  . Sulfa Antibiotics    No family history on file. No family history of rheumatological diseases   Past medical history, social, surgical and family history all reviewed in electronic medical record.   Review of Systems: No headache, visual changes, nausea, vomiting, diarrhea, constipation, dizziness, abdominal pain, skin rash, fevers, chills, night sweats, weight loss, swollen lymph nodes,  chest pain, shortness of breath, mood changes.  Positive muscle aches  Objective  There were no vitals taken for this visit.  Systems examined below as of 02/18/17 General: NAD A&O x3 mood, affect normal  HEENT: Pupils equal, extraocular movements intact no nystagmus Respiratory: not short of breath at rest or with speaking Cardiovascular: No lower extremity edema, non tender Skin: Warm dry  intact with no signs of infection or rash on extremities or on axial skeleton. Abdomen: Soft nontender, no masses Neuro: Cranial nerves  intact, neurovascularly intact in all extremities with 2+ DTRs and 2+ pulses. Lymph: No lymphadenopathy appreciated today  Gait normal with good balance and coordination.  MSK: Non tender with full range of motion and good stability and symmetric strength and tone of shoulders, elbows, wrist,  knee hips and ankles bilaterally.    Neck: Inspection unremarkable. No palpable stepoffs. Negative Spurling's maneuver. Continued tightness on the left side with side bending and rotation. Patient does have tightness of the left medial aspect of the scapula Grip strength and sensation normal in bilateral hands Strength good C4 to T1 distribution No sensory change to C4 to T1 Negative Hoffman sign bilaterally Reflexes normal  Osteopathic findings C2 flexed rotated and side bent right C3 flexed rotated and side bent left C7 flexed rotated and side bent left T5 extended rotated and side bent right inhaled rib T9 extended rotated and side bent left L2 flexed rotated and side bent right Sacrum right on right     Impression and Recommendations:     This case required medical decision making of moderate complexity.

## 2017-02-18 NOTE — Patient Instructions (Addendum)
Always great to see you  I thank you for understanding on the scheduling.  Ice when you need it We will continue to do what we are doing  See me again when you need me!

## 2017-07-08 ENCOUNTER — Other Ambulatory Visit: Payer: Self-pay | Admitting: Obstetrics and Gynecology

## 2017-07-08 DIAGNOSIS — Z139 Encounter for screening, unspecified: Secondary | ICD-10-CM

## 2017-08-06 ENCOUNTER — Ambulatory Visit
Admission: RE | Admit: 2017-08-06 | Discharge: 2017-08-06 | Disposition: A | Discharge status: Home / Self Care | Payer: BLUE CROSS/BLUE SHIELD | Source: Ambulatory Visit | Attending: Obstetrics and Gynecology | Admitting: Obstetrics and Gynecology

## 2017-08-06 DIAGNOSIS — Z1231 Encounter for screening mammogram for malignant neoplasm of breast: Secondary | ICD-10-CM | POA: Diagnosis not present

## 2017-08-06 DIAGNOSIS — Z139 Encounter for screening, unspecified: Secondary | ICD-10-CM

## 2017-08-08 ENCOUNTER — Other Ambulatory Visit: Payer: Self-pay | Admitting: Obstetrics and Gynecology

## 2017-08-08 DIAGNOSIS — R928 Other abnormal and inconclusive findings on diagnostic imaging of breast: Secondary | ICD-10-CM

## 2017-08-12 ENCOUNTER — Ambulatory Visit
Admission: RE | Admit: 2017-08-12 | Discharge: 2017-08-12 | Disposition: A | Discharge status: Home / Self Care | Payer: BLUE CROSS/BLUE SHIELD | Source: Ambulatory Visit | Attending: Obstetrics and Gynecology | Admitting: Obstetrics and Gynecology

## 2017-08-12 DIAGNOSIS — R928 Other abnormal and inconclusive findings on diagnostic imaging of breast: Secondary | ICD-10-CM

## 2017-08-12 DIAGNOSIS — R922 Inconclusive mammogram: Secondary | ICD-10-CM | POA: Diagnosis not present

## 2017-08-12 DIAGNOSIS — N6310 Unspecified lump in the right breast, unspecified quadrant: Secondary | ICD-10-CM | POA: Diagnosis not present

## 2017-09-25 ENCOUNTER — Other Ambulatory Visit: Payer: Self-pay | Admitting: Family Medicine

## 2017-10-14 ENCOUNTER — Other Ambulatory Visit (HOSPITAL_COMMUNITY)
Admission: RE | Admit: 2017-10-14 | Discharge: 2017-10-14 | Disposition: A | Discharge status: Home / Self Care | Payer: BLUE CROSS/BLUE SHIELD | Source: Ambulatory Visit | Attending: Obstetrics and Gynecology | Admitting: Obstetrics and Gynecology

## 2017-10-14 ENCOUNTER — Other Ambulatory Visit: Payer: Self-pay | Admitting: Obstetrics and Gynecology

## 2017-10-14 DIAGNOSIS — Z124 Encounter for screening for malignant neoplasm of cervix: Secondary | ICD-10-CM | POA: Insufficient documentation

## 2017-10-14 DIAGNOSIS — Z01411 Encounter for gynecological examination (general) (routine) with abnormal findings: Secondary | ICD-10-CM | POA: Diagnosis not present

## 2017-10-14 DIAGNOSIS — N951 Menopausal and female climacteric states: Secondary | ICD-10-CM | POA: Diagnosis not present

## 2017-10-17 LAB — CYTOLOGY - PAP
Diagnosis: NEGATIVE
HPV: NOT DETECTED

## 2017-11-12 DIAGNOSIS — L719 Rosacea, unspecified: Secondary | ICD-10-CM | POA: Diagnosis not present

## 2017-11-26 DIAGNOSIS — L821 Other seborrheic keratosis: Secondary | ICD-10-CM | POA: Diagnosis not present

## 2017-11-26 DIAGNOSIS — L905 Scar conditions and fibrosis of skin: Secondary | ICD-10-CM | POA: Diagnosis not present

## 2017-12-25 DIAGNOSIS — M9901 Segmental and somatic dysfunction of cervical region: Secondary | ICD-10-CM | POA: Diagnosis not present

## 2017-12-30 DIAGNOSIS — M9901 Segmental and somatic dysfunction of cervical region: Secondary | ICD-10-CM | POA: Diagnosis not present

## 2017-12-31 DIAGNOSIS — M9901 Segmental and somatic dysfunction of cervical region: Secondary | ICD-10-CM | POA: Diagnosis not present

## 2018-01-02 DIAGNOSIS — M9901 Segmental and somatic dysfunction of cervical region: Secondary | ICD-10-CM | POA: Diagnosis not present

## 2018-01-06 DIAGNOSIS — M9901 Segmental and somatic dysfunction of cervical region: Secondary | ICD-10-CM | POA: Diagnosis not present

## 2018-01-08 DIAGNOSIS — M9901 Segmental and somatic dysfunction of cervical region: Secondary | ICD-10-CM | POA: Diagnosis not present

## 2018-01-16 DIAGNOSIS — M9901 Segmental and somatic dysfunction of cervical region: Secondary | ICD-10-CM | POA: Diagnosis not present

## 2018-01-21 DIAGNOSIS — M9901 Segmental and somatic dysfunction of cervical region: Secondary | ICD-10-CM | POA: Diagnosis not present

## 2018-01-22 DIAGNOSIS — M9901 Segmental and somatic dysfunction of cervical region: Secondary | ICD-10-CM | POA: Diagnosis not present

## 2018-04-02 DIAGNOSIS — M549 Dorsalgia, unspecified: Secondary | ICD-10-CM | POA: Diagnosis not present

## 2018-07-01 ENCOUNTER — Other Ambulatory Visit: Payer: Self-pay | Admitting: Obstetrics and Gynecology

## 2018-07-01 DIAGNOSIS — Z1231 Encounter for screening mammogram for malignant neoplasm of breast: Secondary | ICD-10-CM

## 2018-07-23 DIAGNOSIS — U071 COVID-19: Secondary | ICD-10-CM

## 2018-07-23 HISTORY — DX: COVID-19: U07.1

## 2018-08-18 ENCOUNTER — Ambulatory Visit
Admission: RE | Admit: 2018-08-18 | Discharge: 2018-08-18 | Disposition: A | Discharge status: Home / Self Care | Payer: BLUE CROSS/BLUE SHIELD | Source: Ambulatory Visit | Attending: Obstetrics and Gynecology | Admitting: Obstetrics and Gynecology

## 2018-08-18 DIAGNOSIS — Z1231 Encounter for screening mammogram for malignant neoplasm of breast: Secondary | ICD-10-CM | POA: Diagnosis not present

## 2018-09-02 DIAGNOSIS — J0100 Acute maxillary sinusitis, unspecified: Secondary | ICD-10-CM | POA: Diagnosis not present

## 2018-10-22 DIAGNOSIS — Z01419 Encounter for gynecological examination (general) (routine) without abnormal findings: Secondary | ICD-10-CM | POA: Diagnosis not present

## 2019-02-02 DIAGNOSIS — M180 Bilateral primary osteoarthritis of first carpometacarpal joints: Secondary | ICD-10-CM | POA: Diagnosis not present

## 2019-02-02 DIAGNOSIS — R52 Pain, unspecified: Secondary | ICD-10-CM | POA: Diagnosis not present

## 2019-03-03 DIAGNOSIS — L719 Rosacea, unspecified: Secondary | ICD-10-CM | POA: Diagnosis not present

## 2019-03-03 DIAGNOSIS — L821 Other seborrheic keratosis: Secondary | ICD-10-CM | POA: Diagnosis not present

## 2019-03-03 DIAGNOSIS — L570 Actinic keratosis: Secondary | ICD-10-CM | POA: Diagnosis not present

## 2019-03-03 DIAGNOSIS — L814 Other melanin hyperpigmentation: Secondary | ICD-10-CM | POA: Diagnosis not present

## 2019-03-03 DIAGNOSIS — D1801 Hemangioma of skin and subcutaneous tissue: Secondary | ICD-10-CM | POA: Diagnosis not present

## 2019-03-03 DIAGNOSIS — D485 Neoplasm of uncertain behavior of skin: Secondary | ICD-10-CM | POA: Diagnosis not present

## 2019-03-03 DIAGNOSIS — L820 Inflamed seborrheic keratosis: Secondary | ICD-10-CM | POA: Diagnosis not present

## 2019-03-16 DIAGNOSIS — M180 Bilateral primary osteoarthritis of first carpometacarpal joints: Secondary | ICD-10-CM | POA: Diagnosis not present

## 2019-03-16 DIAGNOSIS — M65312 Trigger thumb, left thumb: Secondary | ICD-10-CM | POA: Diagnosis not present

## 2019-03-16 DIAGNOSIS — M65311 Trigger thumb, right thumb: Secondary | ICD-10-CM | POA: Diagnosis not present

## 2019-03-20 DIAGNOSIS — M65311 Trigger thumb, right thumb: Secondary | ICD-10-CM | POA: Diagnosis not present

## 2019-03-20 DIAGNOSIS — M65312 Trigger thumb, left thumb: Secondary | ICD-10-CM | POA: Diagnosis not present

## 2019-04-28 DIAGNOSIS — J302 Other seasonal allergic rhinitis: Secondary | ICD-10-CM | POA: Diagnosis not present

## 2019-04-28 DIAGNOSIS — R0789 Other chest pain: Secondary | ICD-10-CM | POA: Diagnosis not present

## 2019-06-25 ENCOUNTER — Other Ambulatory Visit: Payer: Self-pay

## 2019-06-25 DIAGNOSIS — Z20822 Contact with and (suspected) exposure to covid-19: Secondary | ICD-10-CM

## 2019-06-25 DIAGNOSIS — Z9189 Other specified personal risk factors, not elsewhere classified: Secondary | ICD-10-CM | POA: Diagnosis not present

## 2019-06-25 DIAGNOSIS — Z20828 Contact with and (suspected) exposure to other viral communicable diseases: Secondary | ICD-10-CM | POA: Diagnosis not present

## 2019-06-28 LAB — NOVEL CORONAVIRUS, NAA: SARS-CoV-2, NAA: NOT DETECTED

## 2019-08-02 DIAGNOSIS — Z9189 Other specified personal risk factors, not elsewhere classified: Secondary | ICD-10-CM | POA: Diagnosis not present

## 2019-08-02 DIAGNOSIS — Z20822 Contact with and (suspected) exposure to covid-19: Secondary | ICD-10-CM | POA: Diagnosis not present

## 2019-08-03 DIAGNOSIS — R300 Dysuria: Secondary | ICD-10-CM | POA: Diagnosis not present

## 2019-08-25 ENCOUNTER — Other Ambulatory Visit: Payer: Self-pay | Admitting: Obstetrics and Gynecology

## 2019-08-25 DIAGNOSIS — Z1231 Encounter for screening mammogram for malignant neoplasm of breast: Secondary | ICD-10-CM

## 2019-08-26 ENCOUNTER — Other Ambulatory Visit: Payer: Self-pay

## 2019-08-26 ENCOUNTER — Ambulatory Visit
Admission: RE | Admit: 2019-08-26 | Discharge: 2019-08-26 | Disposition: A | Discharge status: Home / Self Care | Payer: BC Managed Care – PPO | Source: Ambulatory Visit

## 2019-08-26 DIAGNOSIS — Z1231 Encounter for screening mammogram for malignant neoplasm of breast: Secondary | ICD-10-CM | POA: Diagnosis not present

## 2019-09-24 DIAGNOSIS — N941 Unspecified dyspareunia: Secondary | ICD-10-CM | POA: Diagnosis not present

## 2019-09-24 DIAGNOSIS — N898 Other specified noninflammatory disorders of vagina: Secondary | ICD-10-CM | POA: Diagnosis not present

## 2019-10-27 DIAGNOSIS — Z01419 Encounter for gynecological examination (general) (routine) without abnormal findings: Secondary | ICD-10-CM | POA: Diagnosis not present

## 2019-10-29 DIAGNOSIS — R0981 Nasal congestion: Secondary | ICD-10-CM | POA: Diagnosis not present

## 2019-10-29 DIAGNOSIS — R5383 Other fatigue: Secondary | ICD-10-CM | POA: Diagnosis not present

## 2019-10-29 DIAGNOSIS — R05 Cough: Secondary | ICD-10-CM | POA: Diagnosis not present

## 2019-10-29 DIAGNOSIS — Z1152 Encounter for screening for COVID-19: Secondary | ICD-10-CM | POA: Diagnosis not present

## 2020-02-08 DIAGNOSIS — L719 Rosacea, unspecified: Secondary | ICD-10-CM | POA: Diagnosis not present

## 2020-02-08 DIAGNOSIS — L821 Other seborrheic keratosis: Secondary | ICD-10-CM | POA: Diagnosis not present

## 2020-02-08 DIAGNOSIS — D1801 Hemangioma of skin and subcutaneous tissue: Secondary | ICD-10-CM | POA: Diagnosis not present

## 2020-02-08 DIAGNOSIS — L820 Inflamed seborrheic keratosis: Secondary | ICD-10-CM | POA: Diagnosis not present

## 2020-02-08 DIAGNOSIS — L814 Other melanin hyperpigmentation: Secondary | ICD-10-CM | POA: Diagnosis not present

## 2020-05-24 DIAGNOSIS — M25561 Pain in right knee: Secondary | ICD-10-CM | POA: Diagnosis not present

## 2020-07-27 ENCOUNTER — Other Ambulatory Visit: Payer: Self-pay | Admitting: Obstetrics and Gynecology

## 2020-07-27 DIAGNOSIS — Z1231 Encounter for screening mammogram for malignant neoplasm of breast: Secondary | ICD-10-CM

## 2020-09-05 ENCOUNTER — Ambulatory Visit
Admission: RE | Admit: 2020-09-05 | Discharge: 2020-09-05 | Disposition: A | Discharge status: Home / Self Care | Payer: BC Managed Care – PPO | Source: Ambulatory Visit | Attending: Obstetrics and Gynecology | Admitting: Obstetrics and Gynecology

## 2020-09-05 ENCOUNTER — Other Ambulatory Visit: Payer: Self-pay

## 2020-09-05 DIAGNOSIS — Z1231 Encounter for screening mammogram for malignant neoplasm of breast: Secondary | ICD-10-CM

## 2020-09-09 ENCOUNTER — Other Ambulatory Visit: Payer: Self-pay | Admitting: Obstetrics and Gynecology

## 2020-09-09 DIAGNOSIS — R928 Other abnormal and inconclusive findings on diagnostic imaging of breast: Secondary | ICD-10-CM

## 2020-09-23 ENCOUNTER — Other Ambulatory Visit: Payer: Self-pay

## 2020-09-23 ENCOUNTER — Ambulatory Visit
Admission: RE | Admit: 2020-09-23 | Discharge: 2020-09-23 | Disposition: A | Discharge status: Home / Self Care | Payer: BC Managed Care – PPO | Source: Ambulatory Visit | Attending: Obstetrics and Gynecology | Admitting: Obstetrics and Gynecology

## 2020-09-23 ENCOUNTER — Other Ambulatory Visit: Payer: Self-pay | Admitting: Obstetrics and Gynecology

## 2020-09-23 DIAGNOSIS — R928 Other abnormal and inconclusive findings on diagnostic imaging of breast: Secondary | ICD-10-CM

## 2020-09-23 DIAGNOSIS — N6489 Other specified disorders of breast: Secondary | ICD-10-CM | POA: Diagnosis not present

## 2020-09-23 DIAGNOSIS — N6001 Solitary cyst of right breast: Secondary | ICD-10-CM | POA: Diagnosis not present

## 2020-09-23 DIAGNOSIS — R922 Inconclusive mammogram: Secondary | ICD-10-CM | POA: Diagnosis not present

## 2020-10-14 DIAGNOSIS — J019 Acute sinusitis, unspecified: Secondary | ICD-10-CM | POA: Diagnosis not present

## 2020-10-14 DIAGNOSIS — J302 Other seasonal allergic rhinitis: Secondary | ICD-10-CM | POA: Diagnosis not present

## 2020-10-27 DIAGNOSIS — Z01419 Encounter for gynecological examination (general) (routine) without abnormal findings: Secondary | ICD-10-CM | POA: Diagnosis not present

## 2020-11-14 ENCOUNTER — Emergency Department (HOSPITAL_BASED_OUTPATIENT_CLINIC_OR_DEPARTMENT_OTHER)
Admission: EM | Admit: 2020-11-14 | Discharge: 2020-11-14 | Disposition: A | Discharge status: Home / Self Care | Payer: BC Managed Care – PPO | Attending: Emergency Medicine | Admitting: Emergency Medicine

## 2020-11-14 ENCOUNTER — Emergency Department (HOSPITAL_BASED_OUTPATIENT_CLINIC_OR_DEPARTMENT_OTHER): Payer: BC Managed Care – PPO | Admitting: Radiology

## 2020-11-14 ENCOUNTER — Encounter (HOSPITAL_BASED_OUTPATIENT_CLINIC_OR_DEPARTMENT_OTHER): Payer: Self-pay | Admitting: Emergency Medicine

## 2020-11-14 ENCOUNTER — Other Ambulatory Visit: Payer: Self-pay

## 2020-11-14 DIAGNOSIS — R059 Cough, unspecified: Secondary | ICD-10-CM

## 2020-11-14 DIAGNOSIS — R197 Diarrhea, unspecified: Secondary | ICD-10-CM | POA: Insufficient documentation

## 2020-11-14 DIAGNOSIS — R0981 Nasal congestion: Secondary | ICD-10-CM | POA: Insufficient documentation

## 2020-11-14 DIAGNOSIS — R509 Fever, unspecified: Secondary | ICD-10-CM | POA: Diagnosis not present

## 2020-11-14 DIAGNOSIS — Z20822 Contact with and (suspected) exposure to covid-19: Secondary | ICD-10-CM | POA: Diagnosis not present

## 2020-11-14 DIAGNOSIS — E876 Hypokalemia: Secondary | ICD-10-CM

## 2020-11-14 LAB — GASTROINTESTINAL PANEL BY PCR, STOOL (REPLACES STOOL CULTURE)
Adenovirus F40/41: NOT DETECTED
Astrovirus: NOT DETECTED
Campylobacter species: NOT DETECTED
Cryptosporidium: NOT DETECTED
Cyclospora cayetanensis: NOT DETECTED
Entamoeba histolytica: NOT DETECTED
Enteroaggregative E coli (EAEC): NOT DETECTED
Enteropathogenic E coli (EPEC): DETECTED — AB
Enterotoxigenic E coli (ETEC): NOT DETECTED
Giardia lamblia: NOT DETECTED
Norovirus GI/GII: NOT DETECTED
Plesimonas shigelloides: NOT DETECTED
Rotavirus A: NOT DETECTED
Salmonella species: DETECTED — AB
Sapovirus (I, II, IV, and V): NOT DETECTED
Shiga like toxin producing E coli (STEC): NOT DETECTED
Shigella/Enteroinvasive E coli (EIEC): NOT DETECTED
Vibrio cholerae: NOT DETECTED
Vibrio species: NOT DETECTED
Yersinia enterocolitica: NOT DETECTED

## 2020-11-14 LAB — RESP PANEL BY RT-PCR (FLU A&B, COVID) ARPGX2
Influenza A by PCR: NEGATIVE
Influenza B by PCR: NEGATIVE
SARS Coronavirus 2 by RT PCR: NEGATIVE

## 2020-11-14 LAB — CBC
HCT: 40.6 % (ref 36.0–46.0)
Hemoglobin: 13.6 g/dL (ref 12.0–15.0)
MCH: 29.7 pg (ref 26.0–34.0)
MCHC: 33.5 g/dL (ref 30.0–36.0)
MCV: 88.6 fL (ref 80.0–100.0)
Platelets: 223 10*3/uL (ref 150–400)
RBC: 4.58 MIL/uL (ref 3.87–5.11)
RDW: 13.2 % (ref 11.5–15.5)
WBC: 3.6 10*3/uL — ABNORMAL LOW (ref 4.0–10.5)
nRBC: 0 % (ref 0.0–0.2)

## 2020-11-14 LAB — COMPREHENSIVE METABOLIC PANEL
ALT: 12 U/L (ref 0–44)
AST: 26 U/L (ref 15–41)
Albumin: 3.9 g/dL (ref 3.5–5.0)
Alkaline Phosphatase: 58 U/L (ref 38–126)
Anion gap: 12 (ref 5–15)
BUN: 14 mg/dL (ref 6–20)
CO2: 25 mmol/L (ref 22–32)
Calcium: 9.5 mg/dL (ref 8.9–10.3)
Chloride: 99 mmol/L (ref 98–111)
Creatinine, Ser: 0.93 mg/dL (ref 0.44–1.00)
GFR, Estimated: >60 mL/min (ref >60)
Glucose, Bld: 102 mg/dL — ABNORMAL HIGH (ref 70–99)
Potassium: 3.1 mmol/L — ABNORMAL LOW (ref 3.5–5.1)
Sodium: 136 mmol/L (ref 135–145)
Total Bilirubin: 0.5 mg/dL (ref 0.3–1.2)
Total Protein: 7.3 g/dL (ref 6.5–8.1)

## 2020-11-14 LAB — C DIFFICILE QUICK SCREEN W PCR REFLEX
C Diff antigen: NEGATIVE
C Diff interpretation: NOT DETECTED
C Diff toxin: NEGATIVE

## 2020-11-14 MED ORDER — AZITHROMYCIN 500 MG PO TABS: 500.0000 mg | ORAL_TABLET | Freq: Every day | ORAL | 0 refills | Status: AC

## 2020-11-14 MED ORDER — POTASSIUM CHLORIDE CRYS ER 20 MEQ PO TBCR
40.0000 meq | EXTENDED_RELEASE_TABLET | Freq: Once | ORAL | Status: AC
  Administered 2020-11-14: 40 meq via ORAL
  Filled 2020-11-14: qty 2

## 2020-11-14 MED ORDER — SODIUM CHLORIDE 0.9 % IV BOLUS
1000.0000 mL | Freq: Once | INTRAVENOUS | Status: AC
  Administered 2020-11-14: 1000 mL via INTRAVENOUS

## 2020-11-14 MED ORDER — ACETAMINOPHEN 500 MG PO TABS
1000.0000 mg | ORAL_TABLET | Freq: Once | ORAL | Status: AC
  Administered 2020-11-14: 1000 mg via ORAL
  Filled 2020-11-14: qty 2

## 2020-11-14 MED ORDER — POTASSIUM CHLORIDE CRYS ER 20 MEQ PO TBCR: 20.0000 meq | EXTENDED_RELEASE_TABLET | Freq: Every day | ORAL | 0 refills | Status: DC

## 2020-11-14 MED ORDER — LACTATED RINGERS IV BOLUS
1000.0000 mL | Freq: Once | INTRAVENOUS | Status: AC
  Administered 2020-11-14: 1000 mL via INTRAVENOUS

## 2020-11-14 MED ORDER — AZITHROMYCIN 500 MG PO TABS: 500.0000 mg | ORAL_TABLET | Freq: Every day | ORAL | 0 refills | Status: DC

## 2020-11-14 NOTE — Discharge Instructions (Addendum)
It was our pleasure to provide your ER care today - we hope that you feel better.  Rest. Drink plenty of fluids.  From today's labs, your potassium level is mildly low  - eat plenty of fruits and vegetables, take potassium supplement as prescribed.    For diarrhea, take zithromax (antibiotic) as prescribed. Your stool studies are pending and should be resulted later today - you may check MyChart and/or call for results.   Follow up with primary care doctor/GI doctor in 3-4 days if symptoms fail to improve/resolve.  Return to ER if worse, new symptoms, severe pain, persistent vomiting, or other concern.

## 2020-11-14 NOTE — ED Provider Notes (Addendum)
[facility] EMERGENCY DEPT Provider Note   CSN: [id number] Arrival date & time: 11/14/20  7681     History Chief Complaint  Patient presents with  . Diarrhea    Karen Navarro is a 55 y.o. female.  Patient c/o diarrhea for the past five days. Symptoms acute onset, moderate, several episodes a day, loose to watery, not particularly foul or foul smelling. No bloody diarrhea. No abd pain. No nausea/vomiting. In past two days also w nasal congestion, mild rhinorrhea, non prod cough, and fevers. Denies sore throat. No known ill contacts, covid or flu exposure, or known bad food ingestion. States returned from trip to [place] 10 days ago, was staying at nice/reputable resort, no known bad food, no illness among travel companions. Was on antibiotics for sinus congestion ?sinus infection a 3-4 weeks ago. No cp or sob. No abd pain or nv. No headache. No neck pain/stiffness.   The history is provided by the patient.  Diarrhea Associated symptoms: fever   Associated symptoms: no abdominal pain, no headaches and no vomiting        Past Medical History:  Diagnosis Date  . Chicken pox   . Multiple allergies     Patient Active Problem List   Diagnosis Date Noted  . Cervical disc disorder with radiculopathy of cervical region 12/07/2013  . Neck pain on left side 11/20/2013  . 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 Nonallopathic lesion of cervical region 11/20/2013  . Nonallopathic lesion of lumbosacral region 11/20/2013  . Nonallopathic lesion of sacral region 11/20/2013  . Nonallopathic lesion-rib cage 11/20/2013  . Nonallopathic lesion of thoracic region 11/20/2013    History reviewed. No pertinent surgical history.   OB History   No obstetric history on file.     History reviewed. No pertinent family history.  Social History   Tobacco Use  . Smoking status: Never Smoker  . Smokeless tobacco: Never Used  Vaping Use  . Vaping Use: Never used  Substance Use  Topics  . Alcohol use: Yes    Comment: weekends  . Drug use: No    Home Medications Prior to Admission medications   Medication Sig Start Date End Date Taking? Authorizing Provider  gabapentin (NEURONTIN) 100 MG capsule TAKE 2 CAPSULES(200 MG) BY MOUTH AT BEDTIME 09/25/17   Judi Saa, DO  sertraline (ZOLOFT) 25 MG tablet Take 25 mg by mouth daily.      [provider]  valACYclovir (VALTREX) 500 MG tablet  08/31/13   [provider]    Allergies    Sulfa antibiotics  Review of Systems   Review of Systems  Constitutional: Positive for fever.  HENT: Positive for congestion and rhinorrhea. Negative for sore throat.   Eyes: Negative for redness.  Respiratory: Positive for cough. Negative for shortness of breath.   Cardiovascular: Negative for chest pain.  Gastrointestinal: Positive for diarrhea. Negative for abdominal pain, nausea and vomiting.  Genitourinary: Negative for dysuria and flank pain.  Musculoskeletal: Negative for back pain and neck pain.  Skin: Negative for rash.  Neurological: Negative for headaches.  Hematological: Does not bruise/bleed easily.  Psychiatric/Behavioral: Negative for confusion.    Physical Exam Updated Vital Signs BP 131/80 (BP Location: Right Arm)   Pulse 88   Temp 97.9 F (36.6 C) (Oral)   Resp 18   Ht 1.651 m (5\' 5" )   Wt 62.1 kg   SpO2 100%   BMI 22.80 kg/m   Physical Exam Vitals and nursing note reviewed.  Constitutional:      Appearance: Normal appearance. She is well-developed.  HENT:     Head: Atraumatic.     Comments: No sinus or temporal tenderness.     Nose: Congestion present.     Mouth/Throat:     Mouth: Mucous membranes are moist.     Pharynx: Oropharynx is clear. No oropharyngeal exudate or posterior oropharyngeal erythema.  Eyes:     General: No scleral icterus.    Conjunctiva/sclera: Conjunctivae normal.     Pupils: Pupils are equal, round, and reactive to light.  Neck:     Trachea: No  tracheal deviation.  Cardiovascular:     Rate and Rhythm: Normal rate and regular rhythm.     Pulses: Normal pulses.     Heart sounds: Normal heart sounds. No murmur heard. No friction rub. No gallop.   Pulmonary:     Effort: Pulmonary effort is normal. No respiratory distress.     Breath sounds: Normal breath sounds.  Abdominal:     General: Bowel sounds are normal. There is no distension.     Palpations: Abdomen is soft.     Tenderness: There is no abdominal tenderness. There is no guarding.  Genitourinary:    Comments: No cva tenderness.  Musculoskeletal:        General: No swelling.     Cervical back: Normal range of motion and neck supple. No rigidity. No muscular tenderness.  Skin:    General: Skin is warm and dry.     Findings: No rash.  Neurological:     Mental Status: She is alert.     Comments: Alert, speech normal.   Psychiatric:        Mood and Affect: Mood normal.     ED Results / Procedures / Treatments   Labs (all labs ordered are listed, but only abnormal results are displayed) Results for orders placed or performed during the hospital encounter of 11/14/20  Resp Panel by RT-PCR (Flu A&B, Covid) Nasopharyngeal Swab   Specimen: Nasopharyngeal Swab; Nasopharyngeal(NP) swabs in vial transport medium  Result Value Ref Range   SARS Coronavirus 2 by RT PCR NEGATIVE NEGATIVE   Influenza A by PCR NEGATIVE NEGATIVE   Influenza B by PCR NEGATIVE NEGATIVE  CBC  Result Value Ref Range   WBC 3.6 (L) 4.0 - 10.5 K/uL   RBC 4.58 3.87 - 5.11 MIL/uL   Hemoglobin 13.6 12.0 - 15.0 g/dL   HCT 69.6 29.5 - 28.4 %   MCV 88.6 80.0 - 100.0 fL   MCH 29.7 26.0 - 34.0 pg   MCHC 33.5 30.0 - 36.0 g/dL   RDW 13.2 44.0 - 10.2 %   Platelets 223 150 - 400 K/uL   nRBC 0.0 0.0 - 0.2 %  Comprehensive metabolic panel  Result Value Ref Range   Sodium 136 135 - 145 mmol/L   Potassium 3.1 (L) 3.5 - 5.1 mmol/L   Chloride 99 98 - 111 mmol/L   CO2 25 22 - 32 mmol/L   Glucose, Bld 102 (H)  70 - 99 mg/dL   BUN 14 6 - 20 mg/dL   Creatinine, Ser 7.25 0.44 - 1.00 mg/dL   Calcium 9.5 8.9 - 36.6 mg/dL   Total Protein 7.3 6.5 - 8.1 g/dL   Albumin 3.9 3.5 - 5.0 g/dL   AST 26 15 - 41 U/L   ALT 12 0 - 44 U/L   Alkaline Phosphatase 58 38 - 126 U/L   Total Bilirubin 0.5 0.3 - 1.2  mg/dL   GFR, Estimated >54 >27 mL/min   Anion gap 12 5 - 15   DG Chest 2 View  Result Date: 11/14/2020 CLINICAL DATA:  Cough and fever EXAM: CHEST - 2 VIEW COMPARISON:  None. FINDINGS: Lungs are clear. Heart size and pulmonary vascularity are normal. No adenopathy. There is lower thoracic levoscoliosis. IMPRESSION: Lungs clear.  Cardiac silhouette normal. Electronically Signed   By: Bretta Bang III M.D.   On: 11/14/2020 09:10    EKG None  Radiology No results found.  Procedures Procedures   Medications Ordered in ED Medications  sodium chloride 0.9 % bolus 1,000 mL (has no administration in time range)    ED Course  I have reviewed the triage vital signs and the nursing notes.  Pertinent labs & imaging results that were available during my care of the patient were reviewed by me and considered in my medical decision making (see chart for details).    MDM Rules/Calculators/A&P                         Iv ns bolus. Labs sent. Imaging ordered.   Reviewed nursing notes and prior charts for additional history.   Labs reviewed/interpreted by me - chem normal except k low, kcl po. Covid/flu neg.   CXR reviewed/interpreted by me - no pna.   Additional iv fluid bolus.   Stool studies remain pending.   Abd is soft and non tender. Tolerating po.  Given prolonged diarrhea, 5-6 days, will tx antibiotic. Pt hesitant to take cipro - confirmed only allergy is sulfa. zithromax po.  rx kcl also provided.   Overall pt is well, not toxic appearing. No abd pain. Abd soft non tender on exam.   Pt currently appears stable for d/c.   Return precautions provided.      Final Clinical  Impression(s) / ED Diagnoses Final diagnoses:  None    Rx / DC Orders ED Discharge Orders    None         Cathren Laine, MD 11/14/20 1242

## 2020-11-14 NOTE — ED Triage Notes (Signed)
Pt via pov from home with diarrhea since Wednesday, accompanied by fever, anorexia, congestion, cough. Pt states she feels weak due to the diarrhea. Pt recently visited [place] for vacation. Pt alert & oriented, nad noted.

## 2020-11-20 DIAGNOSIS — N3001 Acute cystitis with hematuria: Secondary | ICD-10-CM | POA: Diagnosis not present

## 2020-11-30 ENCOUNTER — Encounter: Payer: Self-pay | Admitting: Allergy

## 2020-11-30 ENCOUNTER — Ambulatory Visit (INDEPENDENT_AMBULATORY_CARE_PROVIDER_SITE_OTHER): Payer: BC Managed Care – PPO | Admitting: Allergy

## 2020-11-30 ENCOUNTER — Other Ambulatory Visit: Payer: Self-pay

## 2020-11-30 VITALS — BP 110/70 | HR 89 | Temp 98.2°F | Resp 12 | Ht 65.0 in | Wt 134.8 lb

## 2020-11-30 DIAGNOSIS — H1013 Acute atopic conjunctivitis, bilateral: Secondary | ICD-10-CM | POA: Diagnosis not present

## 2020-11-30 DIAGNOSIS — K2100 Gastro-esophageal reflux disease with esophagitis, without bleeding: Secondary | ICD-10-CM | POA: Diagnosis not present

## 2020-11-30 DIAGNOSIS — J3089 Other allergic rhinitis: Secondary | ICD-10-CM

## 2020-11-30 MED ORDER — LEVOCETIRIZINE DIHYDROCHLORIDE 5 MG PO TABS: ORAL_TABLET | ORAL | 5 refills | Status: DC

## 2020-11-30 MED ORDER — FAMOTIDINE 20 MG PO TABS: ORAL_TABLET | ORAL | 5 refills | Status: DC

## 2020-11-30 MED ORDER — AZELASTINE-FLUTICASONE 137-50 MCG/ACT NA SUSP: 1.0000 | Freq: Two times a day (BID) | NASAL | 5 refills | Status: DC

## 2020-11-30 NOTE — Patient Instructions (Addendum)
-  environmental allergy testing is positive to tree pollen, dust mite, mold and cockroach -allergen avoidance measures discussed/handouts provided -try Xyzal 5mg  daily as needed.  This is a long-acting antihistamine you have not tried yet that may be more effective than Zyrtec, Allegra and Claritin -trial dymista 1 spray each nostril twice a day.  This is a combination nasal spray with Flonase + Astelin (nasal antihistamine).  This helps with both nasal congestion and drainage.     - for best nasal spray technique point tip of bottle towards eye on same side nose -for itchy/watery eyes use OTC Pataday 1 drop each eye daily as needed -allergen immunotherapy discussed today including protocol, benefits and risk.  Informational handout provided.  If interested in this therapuetic option you can check with your insurance carrier for coverage.  Let know if you would like to proceed with this option.   -for reflux control try Pepcid 20mg  1 tab twice a day  Follow-up in 3 months or sooner if needed

## 2020-11-30 NOTE — Progress Notes (Signed)
New Patient Note  RE: Karen Navarro MRN: [id number] DOB: [birth year] Date of Office Visit: 11/30/2020  Referring provider: Juluis Rainier, MD Primary care provider: Juluis Rainier, MD  Chief Complaint: allergies  History of present illness: Karen Navarro is a 55 y.o. female presenting today for consultation for seasonal allergies.   She states this is the 2nd or 3rd year she has noted her allergies seem to be getting worse. Symptoms start around mid-March and consists of sneezing, itchy eyes, constant nasal drainage and throat clearing and blowing her nose.  Zyrtec does not seem to be effective.   She states these symptoms have turned into sinus infections for the last 2 years requiring antibiotic use.  She is always sensing something in her throat she can't clear.  She used Claritin and Allegra years ago and states became immune to it.  Has not used any nasal sprays or eyedrops.  She has seen ENT in 2021 where she had rhinoscopy and states was told she has reflux.  She states she took prilosec for a month without any significant differences.  She also states her dentist told her that they can also tell she has reflux.  No history of asthma, eczema or food allergy.    Review of systems: Review of Systems  Constitutional: Negative.   HENT:       See HPI  Eyes:       See HPI  Respiratory: Negative.   Cardiovascular: Negative.   Gastrointestinal: Negative.   Musculoskeletal: Negative.   Skin: Negative.   Neurological: Negative.     All other systems negative unless noted above in HPI  Past medical history: Past Medical History:  Diagnosis Date  . Chicken pox   . Multiple allergies     Past surgical history: History reviewed. No pertinent surgical history.  Family history:  Family History  Problem Relation Age of Onset  . Asthma Sister   . Allergic rhinitis Sister   . Allergic rhinitis Brother     Social history: Lives in a townhome with carpeting in the  bedroom with gas heating and central cooling.  1 cat in the home.  There is no concern for water damage, mildew or roaches in the home.  She is a collateral admin/tidal specialist.  She denies a smoking history.  Medication List: Current Outpatient Medications  Medication Sig Dispense Refill  . Cyanocobalamin (B-12) 1000 MCG CAPS Take 1,500 mg by mouth daily.    Marland Kitchen erythromycin with ethanol (THERAMYCIN) 2 % external solution Apply topically.    . Multiple Vitamin tablet 1 tablet    . Omega-3 Fatty Acids (FISH OIL) 1000 MG CAPS Take 1,000 mg by mouth daily.    Ethelda Chick Calcium 500 MG TABS 1 tablet with meals    . valACYclovir (VALTREX) 500 MG tablet      No current facility-administered medications for this visit.    Known medication allergies: Allergies  Allergen Reactions  . Other Anaphylaxis  . Sulfa Antibiotics Swelling     Physical examination: Blood pressure 110/70, pulse 89, temperature 98.2 F (36.8 C), temperature source Temporal, resp. rate 12, height 5\' 5"  (1.651 m), weight 134 lb 12.8 oz (61.1 kg), SpO2 98 %.  General: Alert, interactive, in no acute distress. HEENT: PERRLA, TMs pearly gray, turbinates moderately edematous with clear discharge, post-pharynx non erythematous with positive cobblestoning. Neck: Supple without lymphadenopathy. Lungs: Clear to auscultation without wheezing, rhonchi or rales. {no increased work of breathing. CV: Normal S1,  S2 without murmurs. Abdomen: Nondistended, nontender. Skin: Warm and dry, without lesions or rashes. Extremities:  No clubbing, cyanosis or edema. Neuro:   Grossly intact.  Diagnositics/Labs:  Allergy testing: Environmental allergy skin prick testing is positive to ash, cedar, dust mite DP. Intradermal testing is positive to mold mix 2 and cockroach Allergy testing results were read and interpreted by provider, documented by clinical staff.   Assessment and plan: Allergic rhinitis with conjunctivitis  -significant postnasal drip component GERD   -environmental allergy testing is positive to tree pollen, dust mite, mold and cockroach -allergen avoidance measures discussed/handouts provided -try Xyzal 5mg  daily as needed.  This is a long-acting antihistamine you have not tried yet that may be more effective than Zyrtec, Allegra and Claritin -trial dymista 1 spray each nostril twice a day.  This is a combination nasal spray with Flonase + Astelin (nasal antihistamine).  This helps with both nasal congestion and drainage.     - for best nasal spray technique point tip of bottle towards eye on same side nose -for itchy/watery eyes use OTC Pataday 1 drop each eye daily as needed -allergen immunotherapy discussed today including protocol, benefits and risk.  Informational handout provided.  If interested in this therapuetic option you can check with your insurance carrier for coverage.  Let know if you would like to proceed with this option.   -for reflux control try Pepcid 20mg  1 tab twice a day  Follow-up in 3 months or sooner if needed  I appreciate the opportunity to take part in [facility] care. Please do not hesitate to contact me with questions.  Sincerely,   , MD Allergy/Immunology [facility]

## 2020-12-02 ENCOUNTER — Other Ambulatory Visit: Payer: Self-pay

## 2020-12-02 MED ORDER — AZELASTINE HCL 0.1 % NA SOLN: NASAL | 11 refills | Status: DC

## 2020-12-02 MED ORDER — FLUTICASONE PROPIONATE 50 MCG/ACT NA SUSP: NASAL | 10 refills | Status: DC

## 2020-12-02 NOTE — Telephone Encounter (Signed)
Called pt and notify her of the medication for Flonase and Astelin beng to the pharmacy, pt verbalized understanding.

## 2021-01-20 DIAGNOSIS — N390 Urinary tract infection, site not specified: Secondary | ICD-10-CM | POA: Diagnosis not present

## 2021-02-07 DIAGNOSIS — L659 Nonscarring hair loss, unspecified: Secondary | ICD-10-CM | POA: Diagnosis not present

## 2021-02-07 DIAGNOSIS — Z87440 Personal history of urinary (tract) infections: Secondary | ICD-10-CM | POA: Diagnosis not present

## 2021-03-08 ENCOUNTER — Ambulatory Visit: Payer: BC Managed Care – PPO | Admitting: Allergy

## 2021-03-30 ENCOUNTER — Ambulatory Visit
Admission: RE | Admit: 2021-03-30 | Discharge: 2021-03-30 | Disposition: A | Discharge status: Home / Self Care | Payer: BC Managed Care – PPO | Source: Ambulatory Visit | Attending: Obstetrics and Gynecology | Admitting: Obstetrics and Gynecology

## 2021-03-30 ENCOUNTER — Other Ambulatory Visit: Payer: Self-pay

## 2021-03-30 ENCOUNTER — Ambulatory Visit: Payer: BC Managed Care – PPO

## 2021-03-30 DIAGNOSIS — R928 Other abnormal and inconclusive findings on diagnostic imaging of breast: Secondary | ICD-10-CM

## 2021-03-30 DIAGNOSIS — R922 Inconclusive mammogram: Secondary | ICD-10-CM | POA: Diagnosis not present

## 2021-04-11 DIAGNOSIS — L820 Inflamed seborrheic keratosis: Secondary | ICD-10-CM | POA: Diagnosis not present

## 2021-04-11 DIAGNOSIS — D229 Melanocytic nevi, unspecified: Secondary | ICD-10-CM | POA: Diagnosis not present

## 2021-04-11 DIAGNOSIS — L814 Other melanin hyperpigmentation: Secondary | ICD-10-CM | POA: Diagnosis not present

## 2021-04-11 DIAGNOSIS — L821 Other seborrheic keratosis: Secondary | ICD-10-CM | POA: Diagnosis not present

## 2021-04-11 DIAGNOSIS — D1801 Hemangioma of skin and subcutaneous tissue: Secondary | ICD-10-CM | POA: Diagnosis not present

## 2021-07-23 DIAGNOSIS — H269 Unspecified cataract: Secondary | ICD-10-CM

## 2021-07-23 HISTORY — DX: Unspecified cataract: H26.9

## 2021-08-21 DIAGNOSIS — M180 Bilateral primary osteoarthritis of first carpometacarpal joints: Secondary | ICD-10-CM | POA: Diagnosis not present

## 2021-08-28 ENCOUNTER — Other Ambulatory Visit: Payer: Self-pay | Admitting: Obstetrics and Gynecology

## 2021-08-28 DIAGNOSIS — Z1231 Encounter for screening mammogram for malignant neoplasm of breast: Secondary | ICD-10-CM

## 2021-09-25 ENCOUNTER — Ambulatory Visit: Payer: BC Managed Care – PPO

## 2021-09-26 ENCOUNTER — Ambulatory Visit
Admission: RE | Admit: 2021-09-26 | Discharge: 2021-09-26 | Disposition: A | Discharge status: Home / Self Care | Payer: BC Managed Care – PPO | Source: Ambulatory Visit | Attending: Obstetrics and Gynecology | Admitting: Obstetrics and Gynecology

## 2021-09-26 ENCOUNTER — Other Ambulatory Visit: Payer: Self-pay

## 2021-09-26 DIAGNOSIS — Z1231 Encounter for screening mammogram for malignant neoplasm of breast: Secondary | ICD-10-CM

## 2021-09-27 ENCOUNTER — Other Ambulatory Visit: Payer: Self-pay | Admitting: Obstetrics and Gynecology

## 2021-09-27 DIAGNOSIS — R928 Other abnormal and inconclusive findings on diagnostic imaging of breast: Secondary | ICD-10-CM

## 2021-10-18 ENCOUNTER — Other Ambulatory Visit: Payer: BC Managed Care – PPO

## 2021-10-20 ENCOUNTER — Ambulatory Visit
Admission: RE | Admit: 2021-10-20 | Discharge: 2021-10-20 | Disposition: A | Discharge status: Home / Self Care | Payer: BC Managed Care – PPO | Source: Ambulatory Visit | Attending: Obstetrics and Gynecology | Admitting: Obstetrics and Gynecology

## 2021-10-20 ENCOUNTER — Other Ambulatory Visit: Payer: BC Managed Care – PPO

## 2021-10-20 DIAGNOSIS — R928 Other abnormal and inconclusive findings on diagnostic imaging of breast: Secondary | ICD-10-CM

## 2021-10-20 DIAGNOSIS — R922 Inconclusive mammogram: Secondary | ICD-10-CM | POA: Diagnosis not present

## 2021-11-07 ENCOUNTER — Other Ambulatory Visit: Payer: Self-pay | Admitting: Nurse Practitioner

## 2021-11-07 ENCOUNTER — Other Ambulatory Visit (HOSPITAL_COMMUNITY)
Admission: RE | Admit: 2021-11-07 | Discharge: 2021-11-07 | Disposition: A | Discharge status: Home / Self Care | Payer: BC Managed Care – PPO | Source: Ambulatory Visit | Attending: Nurse Practitioner | Admitting: Nurse Practitioner

## 2021-11-07 DIAGNOSIS — Z01419 Encounter for gynecological examination (general) (routine) without abnormal findings: Secondary | ICD-10-CM | POA: Diagnosis not present

## 2021-11-07 DIAGNOSIS — R87810 Cervical high risk human papillomavirus (HPV) DNA test positive: Secondary | ICD-10-CM | POA: Diagnosis not present

## 2021-11-10 LAB — CYTOLOGY - PAP
Comment: NEGATIVE
Comment: NEGATIVE
Comment: NEGATIVE
HPV 16: NEGATIVE
HPV 18 / 45: NEGATIVE
High risk HPV: POSITIVE — AB

## 2021-12-09 ENCOUNTER — Encounter (HOSPITAL_BASED_OUTPATIENT_CLINIC_OR_DEPARTMENT_OTHER): Payer: Self-pay | Admitting: Emergency Medicine

## 2021-12-09 ENCOUNTER — Other Ambulatory Visit: Payer: Self-pay

## 2021-12-09 ENCOUNTER — Emergency Department (HOSPITAL_BASED_OUTPATIENT_CLINIC_OR_DEPARTMENT_OTHER): Payer: BC Managed Care – PPO

## 2021-12-09 ENCOUNTER — Emergency Department (INDEPENDENT_AMBULATORY_CARE_PROVIDER_SITE_OTHER)
Admission: EM | Admit: 2021-12-09 | Discharge: 2021-12-09 | Disposition: A | Discharge status: Home / Self Care | Payer: BC Managed Care – PPO | Source: Home / Self Care

## 2021-12-09 ENCOUNTER — Other Ambulatory Visit: Payer: Self-pay | Admitting: Allergy

## 2021-12-09 ENCOUNTER — Emergency Department (HOSPITAL_BASED_OUTPATIENT_CLINIC_OR_DEPARTMENT_OTHER)
Admission: EM | Admit: 2021-12-09 | Discharge: 2021-12-09 | Disposition: A | Discharge status: Home / Self Care | Payer: BC Managed Care – PPO | Attending: Emergency Medicine | Admitting: Emergency Medicine

## 2021-12-09 ENCOUNTER — Encounter: Payer: Self-pay | Admitting: Emergency Medicine

## 2021-12-09 DIAGNOSIS — R29818 Other symptoms and signs involving the nervous system: Secondary | ICD-10-CM | POA: Diagnosis not present

## 2021-12-09 DIAGNOSIS — R42 Dizziness and giddiness: Secondary | ICD-10-CM | POA: Diagnosis not present

## 2021-12-09 DIAGNOSIS — R112 Nausea with vomiting, unspecified: Secondary | ICD-10-CM | POA: Diagnosis not present

## 2021-12-09 DIAGNOSIS — R110 Nausea: Secondary | ICD-10-CM

## 2021-12-09 DIAGNOSIS — R531 Weakness: Secondary | ICD-10-CM | POA: Diagnosis not present

## 2021-12-09 HISTORY — DX: Dizziness and giddiness: R42

## 2021-12-09 LAB — BASIC METABOLIC PANEL
Anion gap: 3 — ABNORMAL LOW (ref 5–15)
BUN: 16 mg/dL (ref 6–20)
CO2: 27 mmol/L (ref 22–32)
Calcium: 8.9 mg/dL (ref 8.9–10.3)
Chloride: 107 mmol/L (ref 98–111)
Creatinine, Ser: 0.62 mg/dL (ref 0.44–1.00)
GFR, Estimated: >60 mL/min (ref >60)
Glucose, Bld: 119 mg/dL — ABNORMAL HIGH (ref 70–99)
Potassium: 4 mmol/L (ref 3.5–5.1)
Sodium: 137 mmol/L (ref 135–145)

## 2021-12-09 LAB — CBC
HCT: 39.6 % (ref 36.0–46.0)
Hemoglobin: 12.9 g/dL (ref 12.0–15.0)
MCH: 29.9 pg (ref 26.0–34.0)
MCHC: 32.6 g/dL (ref 30.0–36.0)
MCV: 91.9 fL (ref 80.0–100.0)
Platelets: 270 10*3/uL (ref 150–400)
RBC: 4.31 MIL/uL (ref 3.87–5.11)
RDW: 13.1 % (ref 11.5–15.5)
WBC: 8.1 10*3/uL (ref 4.0–10.5)
nRBC: 0 % (ref 0.0–0.2)

## 2021-12-09 LAB — POCT FASTING CBG KUC MANUAL ENTRY: POCT Glucose (KUC): 108 mg/dL — AB (ref 70–99)

## 2021-12-09 LAB — TROPONIN I (HIGH SENSITIVITY): Troponin I (High Sensitivity): <2 ng/L (ref <18)

## 2021-12-09 MED ORDER — MECLIZINE HCL 25 MG PO TABS
25.0000 mg | ORAL_TABLET | Freq: Once | ORAL | Status: AC
  Administered 2021-12-09: 25 mg via ORAL
  Filled 2021-12-09: qty 1

## 2021-12-09 MED ORDER — ONDANSETRON 4 MG PO TBDP
4.0000 mg | ORAL_TABLET | Freq: Once | ORAL | Status: DC
  Filled 2021-12-09: qty 1

## 2021-12-09 MED ORDER — MECLIZINE HCL 25 MG PO TABS: 25.0000 mg | ORAL_TABLET | Freq: Three times a day (TID) | ORAL | 0 refills | Status: AC | PRN

## 2021-12-09 NOTE — ED Provider Notes (Signed)
[facility]    CSN: [id number] Arrival date & time: 12/09/21  1404      History   Chief Complaint Chief Complaint  Patient presents with   Dizziness    HPI Karen Navarro is a 56 y.o. female.   HPI 56 year old female presents with dizziness that started suddenly this afternoon roughly 1 hour ago.  PMH significant for cervical disc disorder with radiculopathy of cervical region.  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 No pertinent surgical history.  OB History   No obstetric history on file.      Home Medications    Prior to Admission medications   Medication Sig Start Date End Date Taking? Authorizing Provider  azelastine (ASTELIN) 0.1 % nasal spray Use 2 sprays daily in each nostril as directed. 12/02/20   Marcelyn BruinsPadgett, Shaylar Patricia, MD  Azelastine-Fluticasone 931 173 6828137-50 MCG/ACT SUSP Place 1 spray into the nose 2 (two) times daily. 11/30/20   Marcelyn BruinsPadgett, Shaylar Patricia, MD  Cyanocobalamin (B-12) 1000 MCG CAPS Take 1,500 mg by mouth daily.    [provider]  erythromycin with ethanol (THERAMYCIN) 2 % external solution Apply topically. 07/08/20   [provider]  famotidine (PEPCID) 20 MG tablet Take 1 tablet twice a day for reflux. 11/30/20   Marcelyn BruinsPadgett, Shaylar Patricia, MD  fluticasone (FLONASE) 50 MCG/ACT nasal spray Use 1 -2 spray per nostril twice a day. 12/02/20   Marcelyn BruinsPadgett, Shaylar Patricia, MD  levocetirizine (XYZAL) 5 MG tablet Take  1 tablet as needed. 11/30/20   Marcelyn BruinsPadgett, Shaylar Patricia, MD  Multiple Vitamin tablet 1 tablet    [provider]  Omega-3 Fatty Acids (FISH OIL) 1000 MG CAPS Take 1,000 mg by mouth daily.    [provider]  Ethelda Chickyster Shell Calcium 500 MG TABS 1 tablet with meals    [provider]  valACYclovir (VALTREX) 500 MG tablet  08/31/13   [provider]    Family History Family History  Problem Relation Age of Onset   Asthma Sister    Allergic rhinitis Sister    Allergic rhinitis Brother     Social History Social History   Tobacco Use   Smoking status: Never   Smokeless tobacco: Never  Vaping Use   Vaping Use: Never used  Substance Use Topics   Alcohol use: Yes    Comment: weekends   Drug use: No     Allergies   Other and Sulfa antibiotics   Review of Systems Review of Systems  Gastrointestinal:  Positive for nausea.  Neurological:  Positive for dizziness.  All other systems reviewed and are negative.   Physical Exam Triage Vital Signs ED Triage Vitals [12/09/21 1412]  Enc Vitals Group     BP (!) 156/100     Pulse Rate 86     Resp 20     Temp 98 F (36.7 C)     Temp Source Oral     SpO2 98 %     Weight  Height      Head Circumference      Peak Flow      Pain Score 0     Pain Loc      Pain Edu?      Excl. in GC?    No data found.  Updated Vital Signs BP (!) 156/100 (BP Location: Right Arm)   Pulse 86   Temp 98 F (36.7 C) (Oral)   Resp 20   SpO2 98%     Physical Exam Vitals and nursing note reviewed.  Constitutional:      Appearance: Normal appearance. She is normal weight.  HENT:     Head: Normocephalic and atraumatic.     Right Ear: Tympanic membrane, ear canal and external ear normal.     Left Ear: Tympanic membrane, ear canal and external ear normal.     Mouth/Throat:     Mouth: Mucous membranes are moist.     Pharynx: Oropharynx is clear.  Eyes:     Extraocular Movements: Extraocular movements intact.      Conjunctiva/sclera: Conjunctivae normal.     Pupils: Pupils are equal, round, and reactive to light.  Cardiovascular:     Rate and Rhythm: Normal rate and regular rhythm.     Pulses: Normal pulses.     Heart sounds: Normal heart sounds.  Pulmonary:     Effort: Pulmonary effort is normal.     Breath sounds: Normal breath sounds. No wheezing, rhonchi or rales.  Musculoskeletal:     Cervical back: Normal range of motion and neck supple.  Skin:    General: Skin is warm and dry.  Neurological:     General: No focal deficit present.     Mental Status: She is alert and oriented to person, place, and time. Mental status is at baseline.     Cranial Nerves: No cranial nerve deficit.     Sensory: No sensory deficit.     Motor: Weakness present.     Coordination: Coordination normal.     Gait: Gait normal.     Deep Tendon Reflexes: Reflexes normal.     Comments: Negative Romberg, negative pronator drift, grip is 5/5, all extremities freely     UC Treatments / Results  Labs (all labs ordered are listed, but only abnormal results are displayed) Labs Reviewed  POCT FASTING CBG [facility] MANUAL ENTRY - Abnormal; Notable for the following components:      Result Value   POCT Glucose ([facility]) 108 (*)    All other components within normal limits    EKG   Radiology No results found.  Procedures Procedures (including critical care time)  Medications Ordered in UC Medications - No data to display  Initial Impression / Assessment and Plan / UC Course  I have reviewed the triage vital signs and the nursing notes.  Pertinent labs & imaging results that were available during my care of the patient were reviewed by me and considered in my medical decision making (see chart for details).     MDM: 1.  Dizziness-EKG revealed normal sinus rhythm-Instructed patient/daughter/boyfriend go to [facility] ED NOW for further evaluation of dizziness and nausea.  Advised patient/spouse/daughter  this will involve CT of head, serological test, and other patient/daughter/boyfriend agreed and verbalized understanding of these instructions and this plan of care this afternoon..; 2.  Nausea-Zofran 8 mg given once in clinic Final Clinical Impressions(s) / UC Diagnoses   Final diagnoses:  Dizziness  Nausea     Discharge  Instructions      Instructed patient/spouse/daughter go to [facility] ED NOW for further evaluation of dizziness and nausea.  Advised patient/spouse/daughter this will involve CT of head, serological test, and other.     ED Prescriptions   None    PDMP not reviewed this encounter.   Karen Iha, FNP 12/09/21 1450

## 2021-12-09 NOTE — ED Triage Notes (Addendum)
Pt present with dizziness that started suddenly this am about 1 hour ago while working outside. She is feeling weak and having nausea. She is A/O x4. Daughter present with her

## 2021-12-09 NOTE — Discharge Instructions (Addendum)
Instructed patient/spouse/daughter go to [facility] ED NOW for further evaluation of dizziness and nausea.  Advised patient/spouse/daughter this will involve CT of head, serological test, and other.

## 2021-12-09 NOTE — ED Provider Notes (Signed)
[facility] EMERGENCY DEPARTMENT Provider Note   CSN: [id number] Arrival date & time: 12/09/21  1503     History  Chief Complaint  Patient presents with   Dizziness    Karen Navarro is a 56 y.o. female.  Patient with no pertinent past medical history presents today with complaints of dizziness.  She states that same began gradually around 11 am this morning when she was sitting in the gazebo at her home.  She states she subsequently went and pulled weeds in the yard and her dizziness got worse and has been persistent since then.  She describes her dizziness 'feeling swimmy-headed.'  Endorses associated generalized weakness.  States that it is not triggered by head movements. Also endorses 1 episode of nausea and vomiting earlier today which resolved with Zofran given at urgent care prior to arrival.  She states that this is happened a few times in the past with the last time being 5 years ago and she normally just goes to sleep and wakes up and is finding in.  States that this time she was with her daughter who insisted that she get checked out for symptoms today.  She denies any other symptoms including fevers, chills, headaches, blurred vision, chest pain, shortness of breath, abdominal pain, numbness/tingling.   The history is provided by the patient. No language interpreter was used.  Dizziness Associated symptoms: no chest pain, no headaches, no shortness of breath and no weakness       Home Medications Prior to Admission medications   Medication Sig Start Date End Date Taking? Authorizing Provider  azelastine (ASTELIN) 0.1 % nasal spray Use 2 sprays daily in each nostril as directed. 12/02/20   Marcelyn Bruins, MD  Azelastine-Fluticasone [phone] MCG/ACT SUSP Place 1 spray into the nose 2 (two) times daily. 11/30/20   Marcelyn Bruins, MD  Cyanocobalamin (B-12) 1000 MCG CAPS Take 1,500 mg by mouth daily.    [provider]  erythromycin with  ethanol (THERAMYCIN) 2 % external solution Apply topically. 07/08/20   [provider]  famotidine (PEPCID) 20 MG tablet Take 1 tablet twice a day for reflux. 11/30/20   Marcelyn Bruins, MD  fluticasone (FLONASE) 50 MCG/ACT nasal spray Use 1 -2 spray per nostril twice a day. 12/02/20   Marcelyn Bruins, MD  levocetirizine (XYZAL) 5 MG tablet Take 1 tablet as needed. 11/30/20   Marcelyn Bruins, MD  Multiple Vitamin tablet 1 tablet    [provider]  Omega-3 Fatty Acids (FISH OIL) 1000 MG CAPS Take 1,000 mg by mouth daily.    [provider]  Ethelda Chick Calcium 500 MG TABS 1 tablet with meals    [provider]  valACYclovir (VALTREX) 500 MG tablet  08/31/13   [provider]      Allergies    Other and Sulfa antibiotics    Review of Systems   Review of Systems  Constitutional:  Negative for chills and fever.  Respiratory:  Negative for shortness of breath.   Cardiovascular:  Negative for chest pain.  Neurological:  Positive for dizziness. Negative for tremors, seizures, syncope, facial asymmetry, speech difficulty, weakness, light-headedness, numbness and headaches.  Psychiatric/Behavioral:  Negative for confusion and decreased concentration.   All other systems reviewed and are negative.  Physical Exam Updated Vital Signs BP (!) 161/93 (BP Location: Left Arm)   Pulse 69   Temp 98 F (36.7 C) (Oral)   Resp 17   Ht  (  1.651 m)   Wt 68 kg   SpO2 100%   BMI 24.96 kg/m  Physical Exam Vitals and nursing note reviewed.  Constitutional:      General: She is not in acute distress.    Appearance: Normal appearance. She is normal weight. She is not ill-appearing, toxic-appearing or diaphoretic.  HENT:     Head: Normocephalic and atraumatic.     Right Ear: Tympanic membrane, ear canal and external ear normal.     Left Ear: Tympanic membrane, ear canal and external ear normal.     Mouth/Throat:     Mouth:  Mucous membranes are moist.  Eyes:     Extraocular Movements: Extraocular movements intact.     Pupils: Pupils are equal, round, and reactive to light.  Cardiovascular:     Rate and Rhythm: Normal rate and regular rhythm.     Heart sounds: Normal heart sounds.  Pulmonary:     Effort: Pulmonary effort is normal. No respiratory distress.     Breath sounds: Normal breath sounds.  Abdominal:     General: Abdomen is flat.     Palpations: Abdomen is soft.  Musculoskeletal:        General: Normal range of motion.     Cervical back: Normal range of motion.  Skin:    General: Skin is warm and dry.  Neurological:     General: No focal deficit present.     Mental Status: She is alert and oriented to person, place, and time.     GCS: GCS eye subscore is 4. GCS verbal subscore is 5. GCS motor subscore is 6.     Cranial Nerves: No cranial nerve deficit.     Sensory: Sensation is intact.     Motor: Motor function is intact. No weakness.     Coordination: Coordination is intact.     Gait: Gait is intact. Gait normal.     Comments: Alert and oriented to self, place, time and event.    Speech is fluent, clear without dysarthria or dysphasia.    Strength 5/5 in upper/lower extremities Sensation intact in bilateral upper/lower extremities     Grossly intact visual fields bilaterally. Did not visualize posterior eye.  PERRLA and EOMs intact bilaterally with mild fatiguing horizontal nystagmus Facial movements symmetric  No uvula deviation, symmetric rise of soft palate  Midline tongue protrusion, symmetric L/R movements   Psychiatric:        Mood and Affect: Mood normal.        Behavior: Behavior normal.    ED Results / Procedures / Treatments   Labs (all labs ordered are listed, but only abnormal results are displayed) Labs Reviewed  BASIC METABOLIC PANEL - Abnormal; Notable for the following components:      Result Value   Glucose, Bld 119 (*)    Anion gap 3 (*)    All other  components within normal limits  CBC  URINALYSIS, ROUTINE W REFLEX MICROSCOPIC  TROPONIN I (HIGH SENSITIVITY)    EKG EKG Interpretation  Date/Time:  Saturday Dec 09 2021 15:13:20 EDT Ventricular Rate:  70 PR Interval:  152 QRS Duration: 96 QT Interval:  400 QTC Calculation: 432 R Axis:   68 Text Interpretation: Normal sinus rhythm Minimal voltage criteria for LVH, may be normal variant ( Sokolow-Lyon ) Nonspecific ST abnormality Abnormal ECG When compared with ECG of 09-Dec-2021 14:18, No significant change since last tracing Confirmed by Alvira Monday ([id number]) on 12/09/2021 6:46:59 PM  Radiology CT Head Wo Contrast  Result Date: 12/09/2021 CLINICAL DATA:  Acute onset of dizziness and nausea and vomiting several hours ago. EXAM: CT HEAD WITHOUT CONTRAST TECHNIQUE: Contiguous axial images were obtained from the base of the skull through the vertex without intravenous contrast. RADIATION DOSE REDUCTION: This exam was performed according to the departmental dose-optimization program which includes automated exposure control, adjustment of the mA and/or kV according to patient size and/or use of iterative reconstruction technique. COMPARISON:  None Available. FINDINGS: Brain: No evidence of intracranial hemorrhage, acute infarction, hydrocephalus, extra-axial collection, or mass lesion/mass effect. Vascular:  No hyperdense vessel or other acute findings. Skull: No evidence of fracture or other significant bone abnormality. Sinuses/Orbits:  No acute findings. Other: None. IMPRESSION: Negative noncontrast head CT. Electronically Signed   By: Danae OrleansJohn A Stahl M.D.   On: 12/09/2021 16:25   MR BRAIN WO CONTRAST  Result Date: 12/09/2021 CLINICAL DATA:  Neuro deficit, acute, stroke suspected EXAM: MRI HEAD WITHOUT CONTRAST TECHNIQUE: Multiplanar, multiecho pulse sequences of the brain and surrounding structures were obtained without intravenous contrast. COMPARISON:  None Available. FINDINGS: Brain: There  is no acute infarction or intracranial hemorrhage. There is no intracranial mass, mass effect, or edema. There is no hydrocephalus or extra-axial fluid collection. Ventricles and sulci are normal in size and configuration. Minimal punctate foci of T2 hyperintensity in the supratentorial white matter likely reflect nonspecific gliosis/demyelination there are probably without clinical significance. Vascular: Major vessel flow voids at the skull base are preserved. Skull and upper cervical spine: Normal marrow signal is preserved. Sinuses/Orbits: Paranasal sinuses are aerated. Orbits are unremarkable. Other: Sella is unremarkable.  Mastoid air cells are clear. IMPRESSION: No acute infarction, hemorrhage, or mass. Electronically Signed   By: Guadlupe SpanishPraneil  Patel M.D.   On: 12/09/2021 17:33    Procedures Procedures    Medications Ordered in ED Medications  meclizine (ANTIVERT) tablet 25 mg (25 mg Oral Given 12/09/21 1605)    ED Course/ Medical Decision Making/ A&P                           Medical Decision Making Amount and/or Complexity of Data Reviewed Labs: ordered. Radiology: ordered.   This patient presents to the ED for concern of dizziness, this involves an extensive number of treatment options, and is a complaint that carries with it a high risk of complications and morbidity.   Co morbidities that complicate the patient evaluation  none   Additional history obtained:  Additional history obtained from Urgent Care note from earlier today   Lab Tests:  I Ordered, and personally interpreted labs.  The pertinent results include:  CBG 108, troponin less than 2, no other acute laboratory findings.   Imaging Studies ordered:  I ordered imaging studies including CT head wo contrast and MRI brain wo contrast I independently visualized and interpreted imaging which showed  CT: No acute findings MRI: No acute findings I agree with the radiologist interpretation   Cardiac Monitoring: /  EKG:  The patient was maintained on a cardiac monitor.  I personally viewed and interpreted the cardiac monitored which showed an underlying rhythm of: NSR, no STEMI   Problem List / ED Course / Critical interventions / Medication management  I ordered medication including Meclizine  for dizziness  Reevaluation of the patient after these medicines showed that the patient resolved I have reviewed the patients home medicines and have made adjustments as needed   Test / Admission - Considered:  Patient presents today with  complaints of dizziness.  She is afebrile, nontoxic-appearing, and in no acute distress with reassuring vital signs.  She is also alert and oriented and neurologically intact.  CT and MRI without acute findings.  No acute laboratory findings with unremarkable EKG, therefore low suspicion for acute intracranial abnormality, ACS, or arrhythmia.  Patient also denies headaches, chest pain, shortness of breath.  After administration of meclizine, she states that she feels completely back to normal and would like to be discharged. She is observed to be ambulatory with normal intact gait.  Given this, high suspicion that her symptoms are related to BPPV.  Discussed this with patient as well is the etiology of this diagnosis.  Will discharge with meclizine and information about Epley maneuvers to perform at home.  Patient is understanding and amenable with plan.  Stable for discharge at this time, educated on red flag symptoms of prompt immediate return.  Discharged stable condition.   This is a shared visit with supervising physician Dr. Dalene Seltzer who has independently evaluated patient & provided guidance in evaluation/management/disposition, in agreement with care    Final Clinical Impression(s) / ED Diagnoses Final diagnoses:  Dizziness    Rx / DC Orders ED Discharge Orders          Ordered    meclizine (ANTIVERT) 25 MG tablet  3 times daily PRN        12/09/21 1924           An After Visit Summary was printed and given to the patient.     Vear Clock 12/09/21 1926    Alvira Monday, MD 12/10/21 1235

## 2021-12-09 NOTE — Discharge Instructions (Signed)
As we discussed, your work-up in the ER today was reassuring for acute abnormalities.  Imaging of your brain and laboratory evaluation was unremarkable for acute findings.  Given this as well as your improvement with meclizine, I have a high suspicion that your symptoms are related to vertigo.  I have given you a prescription for meclizine to take as needed if you continue to experience dizziness.  I also recommend that you try the Epley maneuver at home if you continue to be symptomatic.  You can find a YouTube video online to help with this and I have also attached additional information on this maneuver to your discharge paperwork.  Follow-up with your primary care doctor in the next few days for continued evaluation and management of your symptoms as needed.  Return if development of any new or worsening symptoms.

## 2021-12-09 NOTE — ED Triage Notes (Addendum)
Pt arrives pov, referred from UC, c/o dizziness x 4 hr, bilateral arm numbness and tingly, and n/v x 2 hrs. EDP Sch[place] updated. VAN neg, bilaterally equal NIH 0

## 2021-12-09 NOTE — ED Notes (Signed)
Patient is being discharged from the Urgent Care and sent to the Emergency Department via POV driven by boyfriend. Per Karen Iha NP, patient is in need of higher level of care due to dizziness and nausea. Patient is aware and verbalizes understanding of plan of care.  Vitals:   12/09/21 1412  BP: (!) 156/100  Pulse: 86  Resp: 20  Temp: 98 F (36.7 C)  SpO2: 98%

## 2021-12-11 ENCOUNTER — Other Ambulatory Visit: Payer: Self-pay | Admitting: Obstetrics and Gynecology

## 2021-12-11 DIAGNOSIS — N871 Moderate cervical dysplasia: Secondary | ICD-10-CM | POA: Diagnosis not present

## 2021-12-11 DIAGNOSIS — N870 Mild cervical dysplasia: Secondary | ICD-10-CM | POA: Diagnosis not present

## 2021-12-20 DIAGNOSIS — N871 Moderate cervical dysplasia: Secondary | ICD-10-CM | POA: Diagnosis not present

## 2022-01-15 ENCOUNTER — Ambulatory Visit (INDEPENDENT_AMBULATORY_CARE_PROVIDER_SITE_OTHER): Payer: BC Managed Care – PPO | Admitting: Ophthalmology

## 2022-01-15 ENCOUNTER — Encounter (INDEPENDENT_AMBULATORY_CARE_PROVIDER_SITE_OTHER): Payer: Self-pay | Admitting: Ophthalmology

## 2022-01-15 ENCOUNTER — Telehealth (INDEPENDENT_AMBULATORY_CARE_PROVIDER_SITE_OTHER): Payer: Self-pay

## 2022-01-15 ENCOUNTER — Encounter (INDEPENDENT_AMBULATORY_CARE_PROVIDER_SITE_OTHER): Payer: Self-pay

## 2022-01-15 DIAGNOSIS — H2513 Age-related nuclear cataract, bilateral: Secondary | ICD-10-CM | POA: Diagnosis not present

## 2022-01-15 DIAGNOSIS — H33312 Horseshoe tear of retina without detachment, left eye: Secondary | ICD-10-CM | POA: Diagnosis not present

## 2022-01-15 DIAGNOSIS — H43812 Vitreous degeneration, left eye: Secondary | ICD-10-CM

## 2022-01-15 DIAGNOSIS — H4312 Vitreous hemorrhage, left eye: Secondary | ICD-10-CM

## 2022-01-15 NOTE — Progress Notes (Addendum)
01/15/2022     CHIEF COMPLAINT Patient presents for  Chief Complaint  Patient presents with   Retina Evaluation      HISTORY OF PRESENT ILLNESS: Karen Navarro is a 56 y.o. female who presents to the clinic today for:   HPI     Retina Evaluation           Associated Symptoms: Flashes and Floaters.  Negative for Distortion, Blind Spot, Pain, Redness, Photophobia, Glare, Trauma, Scalp Tenderness, Jaw Claudication, Shoulder/Hip pain, Fever, Weight Loss and Fatigue         Comments   ENP- Retinal tear OS, Ref By Dr. Santiago Bumpers. Pt stated, "It all started Thursday evening and I saw this big floaters at the top of my vision and at the bottom. And then on the side of my peripheal, there is a flashing of light. I tried to get seen sooner on Friday but I couldn't reach anyone so I waited over the weekend." Pt reports no decrease in vision but c/o of FOL and floaters in OS.       Last edited by Angeline Slim on 01/15/2022  1:11 PM.      Referring physician: Marlene Bast [address] [place],  [place] [address]  HISTORICAL INFORMATION:   Selected notes from the MEDICAL RECORD NUMBER       CURRENT MEDICATIONS: No current outpatient medications on file. (Ophthalmic Drugs)   No current facility-administered medications for this visit. (Ophthalmic Drugs)   Current Outpatient Medications (Other)  Medication Sig   azelastine (ASTELIN) 0.1 % nasal spray Use 2 sprays daily in each nostril as directed.   Azelastine-Fluticasone 137-50 MCG/ACT SUSP Place 1 spray into the nose 2 (two) times daily.   Cyanocobalamin (B-12) 1000 MCG CAPS Take 1,500 mg by mouth daily.   erythromycin with ethanol (THERAMYCIN) 2 % external solution Apply topically.   famotidine (PEPCID) 20 MG tablet Take 1 tablet twice a day for reflux.   fluticasone (FLONASE) 50 MCG/ACT nasal spray Use 1 -2 spray per nostril twice a day.   levocetirizine (XYZAL) 5 MG tablet Take 1 tablet as needed.   meclizine  (ANTIVERT) 25 MG tablet Take 1 tablet (25 mg total) by mouth 3 (three) times daily as needed for dizziness.   Multiple Vitamin tablet 1 tablet   Omega-3 Fatty Acids (FISH OIL) 1000 MG CAPS Take 1,000 mg by mouth daily.   Oyster Shell Calcium 500 MG TABS 1 tablet with meals   valACYclovir (VALTREX) 500 MG tablet    No current facility-administered medications for this visit. (Other)      REVIEW OF SYSTEMS: ROS   Negative for: Constitutional, Gastrointestinal, Neurological, Skin, Genitourinary, Musculoskeletal, HENT, Endocrine, Cardiovascular, Eyes, Respiratory, Psychiatric, Allergic/Imm, Heme/Lymph Last edited by Angeline Slim on 01/15/2022  1:11 PM.       ALLERGIES Allergies  Allergen Reactions   Other Anaphylaxis   Sulfa Antibiotics Swelling    PAST MEDICAL HISTORY Past Medical History:  Diagnosis Date   Chicken pox    Multiple allergies    History reviewed. No pertinent surgical history.  FAMILY HISTORY Family History  Problem Relation Age of Onset   Asthma Sister    Allergic rhinitis Sister    Allergic rhinitis Brother     SOCIAL HISTORY Social History   Tobacco Use   Smoking status: Never   Smokeless tobacco: Never  Vaping Use   Vaping Use: Never used  Substance Use Topics   Alcohol use: Yes  Comment: weekends   Drug use: No         OPHTHALMIC EXAM:  Base Eye Exam     Visual Acuity (ETDRS)       Right Left   Dist cc 20/20 20/20 -1    Correction: Glasses         Tonometry (Tonopen, 1:15 PM)       Right Left   Pressure 17 21         Pupils       Pupils APD   Right PERRL None   Left PERRL None         Visual Fields       Left Right    Full Full         Extraocular Movement       Right Left    Full Full         Neuro/Psych     Oriented x3: Yes         Dilation     Both eyes: 1.0% Mydriacyl, 2.5% Phenylephrine @ 1:15 PM           Slit Lamp and Fundus Exam     External Exam       Right Left    External Normal Normal         Slit Lamp Exam       Right Left   Lids/Lashes Normal Normal   Conjunctiva/Sclera White and quiet White and quiet   Cornea Clear Clear   Anterior Chamber Deep and quiet Deep and quiet   Iris Round and reactive Round and reactive   Lens Nuclear sclerosis Nuclear sclerosis   Anterior Vitreous Normal Normal         Fundus Exam       Right Left   Posterior Vitreous Normal Small preretinal hemorrhage inferiorly   Disc Normal Normal   C/D Ratio 0.4 0.4   Macula Normal Normal   Vessels Normal Normal   Periphery Normal Traction at 9:00 anterior to the equator of straddle retinal vein with hemorrhage on either side with thin attenuated hole, at 530 meridian pigmentary change with attenuated hole at the ora serrata, 25 D, 20 D, confirmed at 3 M lens contact            IMAGING AND PROCEDURES  Imaging and Procedures for 01/18/22  OCT, Retina - OU - Both Eyes       Right Eye Quality was good. Scan locations included subfoveal. Central Foveal Thickness: 289. Progression has no prior data. Findings include normal foveal contour, vitreomacular adhesion .   Left Eye Quality was good. Scan locations included subfoveal. Central Foveal Thickness: 276. Progression has no prior data. Findings include normal foveal contour.   Notes OD with partial PVD  OS with PVD with hyper reflective material the vitreous cavity signifying the vitreous hemorrhage seen on exam     Color Fundus Photography Optos - OU - Both Eyes       Right Eye Progression has no prior data. Disc findings include normal observations. Macula : normal observations. Vessels : normal observations. Periphery : normal observations.   Left Eye Progression has no prior data.   Notes PVD OS, with vitreous debris     Repair Retinal Breaks, Laser - OS - Left Eye       Tear locations include temporal, inferior.   Time Out Confirmed correct patient, procedure, site, and patient  consented.   Anesthesia Topical anesthesia was used. Anesthetic medications included  Proparacaine 0.5%.   Laser Information The type of laser was diode. Color was yellow. The duration in seconds was 0.03. The spot size was 390 microns. Laser power was 280. Total spots was 182.   Post-op There were no complications. The patient received written and verbal post procedure care education.   Notes Laser retinopexy applied a straddle retinal vein and traction and break as well as small atrophic hole inferiorly             ASSESSMENT/PLAN:  Retinal tear, left Symptomatic retinal tear, 9:00 meridian as well as 530 meridian inferiorly and nasally OS, with accompanying vitreous hemorrhage.  I explained the patient that laser retinopexy will secure the 2 identified holes today so as to prevent progression to retinal detachment.  I also explained the patient that laser repair of these will not affect her current floaters and that these will simply dissolve away over the coming weeks.  Time some of them last for the remainder of life.  OS risk benefits of observation versus treatment were reviewed with the patient.  Documents and booklet reviewed with the patient.  Splane the patient that the procedure will last 3 to 5 minutes and that she should notify us promptly if new symptoms develop over the ensuing 6 weeks.  Do not mash compress or rub the eye or take a boxing  Vitreous hemorrhage, left eye (HCC) OS, secondary to retinal tear  Nuclear sclerotic cataract of both eyes Minor at this time, follow-up Dr. Santiago Bumpers     ICD-10-CM   1. Retinal tear, left  H33.312 Repair Retinal Breaks, Laser - OS - Left Eye    2. Posterior vitreous detachment, left eye  H43.812 OCT, Retina - OU - Both Eyes    Color Fundus Photography Optos - OU - Both Eyes    3. Vitreous hemorrhage, left eye (HCC)  H43.12 Color Fundus Photography Optos - OU - Both Eyes    4. Nuclear sclerotic cataract of both eyes   H25.13       1.  OS, with vitreous hemorrhage signifying likely retinal tear.  Found to have an attenuated area at 9:00 as well as at 530 meridian inferiorly.  Site of the preretinal hemorrhage inferiorly also small area of treatment therefore significant traction on the retina  2.  Patient to report promptly if new onset finding symptoms or others flashes or curtain of darkness developing  3.  01-15-2022 atraumatically without difficulty, documentation done 620 03-2022  Ophthalmic Meds Ordered this visit:  No orders of the defined types were placed in this encounter.      Return in about 6 weeks (around 02/26/2022) for dilate, OS, COLOR FP, POST OP.  There are no Patient Instructions on file for this visit.   Explained the diagnoses, plan, and follow up with the patient and they expressed understanding.  Patient expressed understanding of the importance of proper follow up care.   Al[facility] Symphany Fleissner M.D. Diseases & Surgery of the Retina and Vitreous [facility] 01/18/22     Abbreviations: M myopia (nearsighted); A astigmatism; H hyperopia (farsighted); P presbyopia; Mrx spectacle prescription;  CTL contact lenses; OD right eye; OS left eye; OU both eyes  XT exotropia; ET esotropia; PEK punctate epithelial keratitis; PEE punctate epithelial erosions; DES dry eye syndrome; MGD meibomian gland dysfunction; ATs artificial tears; PFAT's preservative free artificial tears; NSC nuclear sclerotic cataract; PSC posterior subcapsular cataract; ERM epi-retinal membrane; PVD posterior vitreous detachment; RD retinal detachment; DM diabetes  mellitus; DR diabetic retinopathy; NPDR non-proliferative diabetic retinopathy; PDR proliferative diabetic retinopathy; CSME clinically significant macular edema; DME diabetic macular edema; dbh dot blot hemorrhages; CWS cotton wool spot; POAG primary open angle glaucoma; C/D cup-to-disc ratio; HVF humphrey visual field; GVF goldmann visual field; OCT  optical coherence tomography; IOP intraocular pressure; BRVO Branch retinal vein occlusion; CRVO central retinal vein occlusion; CRAO central retinal artery occlusion; BRAO branch retinal artery occlusion; RT retinal tear; SB scleral buckle; PPV pars plana vitrectomy; VH Vitreous hemorrhage; PRP panretinal laser photocoagulation; IVK intravitreal kenalog; VMT vitreomacular traction; MH Macular hole;  NVD neovascularization of the disc; NVE neovascularization elsewhere; AREDS age related eye disease study; ARMD age related macular degeneration; POAG primary open angle glaucoma; EBMD epithelial/anterior basement membrane dystrophy; ACIOL anterior chamber intraocular lens; IOL intraocular lens; PCIOL posterior chamber intraocular lens; Phaco/IOL phacoemulsification with intraocular lens placement; PRK photorefractive keratectomy; LASIK laser assisted in situ keratomileusis; HTN hypertension; DM diabetes mellitus; COPD chronic obstructive pulmonary disease

## 2022-01-15 NOTE — Assessment & Plan Note (Signed)
Minor at this time, follow-up Dr. Santiago Bumpers

## 2022-01-18 NOTE — Addendum Note (Signed)
Addended by: Fawn Kirk A on: 01/18/2022 12:16 PM   Modules accepted: Orders

## 2022-01-25 DIAGNOSIS — N871 Moderate cervical dysplasia: Secondary | ICD-10-CM | POA: Diagnosis not present

## 2022-01-29 ENCOUNTER — Other Ambulatory Visit: Payer: Self-pay

## 2022-01-29 ENCOUNTER — Encounter (HOSPITAL_COMMUNITY): Payer: Self-pay | Admitting: Obstetrics and Gynecology

## 2022-01-29 NOTE — Pre-Procedure Instructions (Signed)
[id number] - [place], [place] - [address] [place] [id number] Phone: [phone] Fax: [phone]   PCP - [facility]  EKG - 12/13/21  ERAS Protcol - Clears until 0900  Anesthesia review: N  Patient verbally denies any shortness of breath, fever, cough and chest pain during phone call   -------------  SDW INSTRUCTIONS given:  Your procedure is scheduled on 01/31/22.  Report to [facility] Main Entrance "A" at 0930 A.M., and check in at the Admitting office.  Call this number if you have problems the morning of surgery:  [phone]   Remember:  Do not eat after midnight the night before your surgery  You may drink clear liquids until 0900 the morning of your surgery.   Clear liquids allowed are: Water, Non-Citrus Juices (without pulp), Carbonated Beverages, Clear Tea, Black Coffee Only, and Gatorade    Take these medicines the morning of surgery with A SIP OF WATER  levocetirizine (XYZAL)  fluticasone (FLONASE)-id needed meclizine (ANTIVERT)-if needed  As of today, STOP taking any Aspirin (unless otherwise instructed by your surgeon) Aleve, Naproxen, Ibuprofen, Motrin, Advil, Goody's, BC's, all herbal medications, fish oil, and all vitamins.                      Do not wear jewelry, make up, or nail polish            Do not wear lotions, powders, perfumes/colognes, or deodorant.            Do not shave 48 hours prior to surgery.  Men may shave face and neck.            Do not bring valuables to the hospital.            [facility] is not responsible for any belongings or valuables.  Do NOT Smoke (Tobacco/Vaping) 24 hours prior to your procedure If you use a CPAP at night, you may bring all equipment for your overnight stay.   Contacts, glasses, dentures or bridgework may not be worn into surgery.      For patients admitted to the hospital, discharge time will be determined by  your treatment team.   Patients discharged the day of surgery will not be allowed to drive home, and someone needs to stay with them for 24 hours.    Special instructions:   [facility]- Preparing For Surgery  Before surgery, you can play an important role. Because skin is not sterile, your skin needs to be as free of germs as possible. You can reduce the number of germs on your skin by washing with CHG (chlorahexidine gluconate) Soap before surgery.  CHG is an antiseptic cleaner which kills germs and bonds with the skin to continue killing germs even after washing.    Oral Hygiene is also important to reduce your risk of infection.  Remember - BRUSH YOUR TEETH THE MORNING OF SURGERY WITH YOUR REGULAR TOOTHPASTE  Please do not use if you have an allergy to CHG or antibacterial soaps. If your skin becomes reddened/irritated stop using the CHG.  Do not shave (including legs and underarms) for at least 48 hours prior to first CHG shower. It is OK to shave your face.  Please follow these instructions carefully.   Shower the NIGHT BEFORE SURGERY and the MORNING OF SURGERY with DIAL Soap.   Pat yourself dry with a  CLEAN TOWEL.  Wear CLEAN PAJAMAS to bed the night before surgery  Place CLEAN SHEETS on your bed the night of your first shower and DO NOT SLEEP WITH PETS.   Day of Surgery: Please shower morning of surgery  Wear Clean/Comfortable clothing the morning of surgery Do not apply any deodorants/lotions.   Remember to brush your teeth WITH YOUR REGULAR TOOTHPASTE.   Questions were answered. Patient verbalized understanding of instructions.

## 2022-01-30 ENCOUNTER — Other Ambulatory Visit: Payer: Self-pay | Admitting: Obstetrics and Gynecology

## 2022-01-30 DIAGNOSIS — N879 Dysplasia of cervix uteri, unspecified: Secondary | ICD-10-CM

## 2022-01-31 ENCOUNTER — Encounter (HOSPITAL_COMMUNITY)
Admission: RE | Disposition: A | Discharge status: Home / Self Care | Payer: Self-pay | Source: Home / Self Care | Attending: Obstetrics and Gynecology

## 2022-01-31 ENCOUNTER — Ambulatory Visit (HOSPITAL_COMMUNITY): Payer: BC Managed Care – PPO | Admitting: Certified Registered Nurse Anesthetist

## 2022-01-31 ENCOUNTER — Ambulatory Visit (HOSPITAL_COMMUNITY)
Admission: RE | Admit: 2022-01-31 | Discharge: 2022-01-31 | Disposition: A | Discharge status: Home / Self Care | Payer: BC Managed Care – PPO | Attending: Obstetrics and Gynecology | Admitting: Obstetrics and Gynecology

## 2022-01-31 ENCOUNTER — Encounter (HOSPITAL_COMMUNITY): Payer: Self-pay | Admitting: Obstetrics and Gynecology

## 2022-01-31 ENCOUNTER — Other Ambulatory Visit: Payer: Self-pay

## 2022-01-31 ENCOUNTER — Other Ambulatory Visit: Payer: Self-pay | Admitting: Obstetrics and Gynecology

## 2022-01-31 DIAGNOSIS — N871 Moderate cervical dysplasia: Secondary | ICD-10-CM | POA: Diagnosis not present

## 2022-01-31 DIAGNOSIS — N879 Dysplasia of cervix uteri, unspecified: Secondary | ICD-10-CM | POA: Diagnosis present

## 2022-01-31 DIAGNOSIS — N72 Inflammatory disease of cervix uteri: Secondary | ICD-10-CM | POA: Diagnosis not present

## 2022-01-31 DIAGNOSIS — M1990 Unspecified osteoarthritis, unspecified site: Secondary | ICD-10-CM | POA: Diagnosis not present

## 2022-01-31 HISTORY — DX: Unspecified osteoarthritis, unspecified site: M19.90

## 2022-01-31 HISTORY — PX: CERVICAL CONIZATION W/BX: SHX1330

## 2022-01-31 HISTORY — DX: Dizziness and giddiness: R42

## 2022-01-31 LAB — CBC
HCT: 39.5 % (ref 36.0–46.0)
Hemoglobin: 13.4 g/dL (ref 12.0–15.0)
MCH: 30.6 pg (ref 26.0–34.0)
MCHC: 33.9 g/dL (ref 30.0–36.0)
MCV: 90.2 fL (ref 80.0–100.0)
Platelets: 245 10*3/uL (ref 150–400)
RBC: 4.38 MIL/uL (ref 3.87–5.11)
RDW: 12.8 % (ref 11.5–15.5)
WBC: 4.3 10*3/uL (ref 4.0–10.5)
nRBC: 0 % (ref 0.0–0.2)

## 2022-01-31 SURGERY — CONE BIOPSY, CERVIX
Anesthesia: General | Site: Cervix

## 2022-01-31 MED ORDER — EPHEDRINE SULFATE-NACL 50-0.9 MG/10ML-% IV SOSY
PREFILLED_SYRINGE | INTRAVENOUS | Status: DC | PRN
  Administered 2022-01-31 (×3): 5 mg via INTRAVENOUS

## 2022-01-31 MED ORDER — LIDOCAINE-EPINEPHRINE 1 %-1:100000 IJ SOLN
INTRAMUSCULAR | Status: AC
  Filled 2022-01-31: qty 1

## 2022-01-31 MED ORDER — MEPERIDINE HCL 25 MG/ML IJ SOLN: 6.2500 mg | INTRAMUSCULAR | Status: DC | PRN

## 2022-01-31 MED ORDER — LACTATED RINGERS IV SOLN: INTRAVENOUS | Status: DC

## 2022-01-31 MED ORDER — ORAL CARE MOUTH RINSE: 15.0000 mL | Freq: Once | OROMUCOSAL | Status: AC

## 2022-01-31 MED ORDER — FENTANYL CITRATE (PF) 250 MCG/5ML IJ SOLN
INTRAMUSCULAR | Status: AC
Start: 2022-01-31 — End: ?
  Filled 2022-01-31: qty 5

## 2022-01-31 MED ORDER — ACETAMINOPHEN 500 MG PO TABS: 1000.0000 mg | ORAL_TABLET | ORAL | Status: AC

## 2022-01-31 MED ORDER — OXYCODONE HCL 5 MG PO TABS: 5.0000 mg | ORAL_TABLET | Freq: Once | ORAL | Status: DC | PRN

## 2022-01-31 MED ORDER — FERRIC SUBSULFATE 259 MG/GM EX SOLN
CUTANEOUS | Status: AC
  Filled 2022-01-31: qty 8

## 2022-01-31 MED ORDER — CHLORHEXIDINE GLUCONATE 0.12 % MT SOLN
OROMUCOSAL | Status: AC
  Administered 2022-01-31: 15 mL via OROMUCOSAL
  Filled 2022-01-31: qty 15

## 2022-01-31 MED ORDER — MIDAZOLAM HCL 2 MG/2ML IJ SOLN
INTRAMUSCULAR | Status: AC
  Filled 2022-01-31: qty 2

## 2022-01-31 MED ORDER — LIDOCAINE-EPINEPHRINE 1 %-1:100000 IJ SOLN
INTRAMUSCULAR | Status: DC | PRN
  Administered 2022-01-31: 20 mL

## 2022-01-31 MED ORDER — ACETIC ACID 5 % SOLN
Status: AC
  Filled 2022-01-31: qty 500

## 2022-01-31 MED ORDER — PROPOFOL 10 MG/ML IV BOLUS
INTRAVENOUS | Status: DC | PRN
  Administered 2022-01-31: 200 mg via INTRAVENOUS

## 2022-01-31 MED ORDER — IBUPROFEN 800 MG PO TABS: 800.0000 mg | ORAL_TABLET | Freq: Three times a day (TID) | ORAL | 0 refills | Status: DC | PRN

## 2022-01-31 MED ORDER — LIDOCAINE 2% (20 MG/ML) 5 ML SYRINGE
INTRAMUSCULAR | Status: AC
  Filled 2022-01-31: qty 5

## 2022-01-31 MED ORDER — IODINE STRONG (LUGOLS) 5 % PO SOLN
ORAL | Status: DC | PRN
  Administered 2022-01-31: 0.2 mL

## 2022-01-31 MED ORDER — OXYCODONE HCL 5 MG PO TABS
5.0000 mg | ORAL_TABLET | Freq: Four times a day (QID) | ORAL | 0 refills | Status: DC | PRN
Start: 2022-01-31 — End: 2022-06-04

## 2022-01-31 MED ORDER — POVIDONE-IODINE 10 % EX SWAB
2.0000 | Freq: Once | CUTANEOUS | Status: AC
  Administered 2022-01-31: 2 via TOPICAL

## 2022-01-31 MED ORDER — CHLORHEXIDINE GLUCONATE 0.12 % MT SOLN
15.0000 mL | Freq: Once | OROMUCOSAL | Status: AC
Start: 2022-01-31 — End: 2022-01-31

## 2022-01-31 MED ORDER — ONDANSETRON HCL 4 MG/2ML IJ SOLN
INTRAMUSCULAR | Status: DC | PRN
  Administered 2022-01-31: 4 mg via INTRAVENOUS

## 2022-01-31 MED ORDER — MIDAZOLAM HCL 2 MG/2ML IJ SOLN
INTRAMUSCULAR | Status: DC | PRN
  Administered 2022-01-31: 2 mg via INTRAVENOUS

## 2022-01-31 MED ORDER — OXYCODONE HCL 5 MG PO TABS: 5.0000 mg | ORAL_TABLET | Freq: Four times a day (QID) | ORAL | 0 refills | Status: DC | PRN

## 2022-01-31 MED ORDER — PROPOFOL 10 MG/ML IV BOLUS
INTRAVENOUS | Status: AC
  Filled 2022-01-31: qty 20

## 2022-01-31 MED ORDER — ONDANSETRON HCL 4 MG/2ML IJ SOLN
INTRAMUSCULAR | Status: AC
  Filled 2022-01-31: qty 2

## 2022-01-31 MED ORDER — LIDOCAINE 2% (20 MG/ML) 5 ML SYRINGE
INTRAMUSCULAR | Status: DC | PRN
  Administered 2022-01-31: 60 mg via INTRAVENOUS

## 2022-01-31 MED ORDER — DEXAMETHASONE SODIUM PHOSPHATE 10 MG/ML IJ SOLN
INTRAMUSCULAR | Status: DC | PRN
  Administered 2022-01-31: 5 mg via INTRAVENOUS

## 2022-01-31 MED ORDER — HYDROMORPHONE HCL 1 MG/ML IJ SOLN: 0.2500 mg | INTRAMUSCULAR | Status: DC | PRN

## 2022-01-31 MED ORDER — OXYCODONE HCL 5 MG/5ML PO SOLN: 5.0000 mg | Freq: Once | ORAL | Status: DC | PRN

## 2022-01-31 MED ORDER — ACETAMINOPHEN 500 MG PO TABS
ORAL_TABLET | ORAL | Status: AC
  Administered 2022-01-31: 1000 mg via ORAL
  Filled 2022-01-31: qty 2

## 2022-01-31 MED ORDER — AMISULPRIDE (ANTIEMETIC) 5 MG/2ML IV SOLN
10.0000 mg | Freq: Once | INTRAVENOUS | Status: DC | PRN
Start: 2022-01-31 — End: 2022-01-31

## 2022-01-31 MED ORDER — FERRIC SUBSULFATE (BULK) SOLN
Status: DC | PRN
  Administered 2022-01-31: 1

## 2022-01-31 MED ORDER — EPHEDRINE 5 MG/ML INJ
INTRAVENOUS | Status: AC
  Filled 2022-01-31: qty 5

## 2022-01-31 MED ORDER — IODINE STRONG (LUGOLS) 5 % PO SOLN
ORAL | Status: AC
  Filled 2022-01-31: qty 1

## 2022-01-31 MED ORDER — PROMETHAZINE HCL 25 MG/ML IJ SOLN: 6.2500 mg | INTRAMUSCULAR | Status: DC | PRN

## 2022-01-31 MED ORDER — DEXAMETHASONE SODIUM PHOSPHATE 10 MG/ML IJ SOLN
INTRAMUSCULAR | Status: AC
  Filled 2022-01-31: qty 1

## 2022-01-31 MED ORDER — PHENYLEPHRINE 80 MCG/ML (10ML) SYRINGE FOR IV PUSH (FOR BLOOD PRESSURE SUPPORT)
PREFILLED_SYRINGE | INTRAVENOUS | Status: AC
  Filled 2022-01-31: qty 10

## 2022-01-31 SURGICAL SUPPLY — 26 items
BLADE SURG 11 STRL SS (BLADE) ×2 IMPLANT
CATH ROBINSON RED A/P 16FR (CATHETERS) ×2 IMPLANT
ELECT BALL LEEP 5MM RED (ELECTRODE) ×2 IMPLANT
ELECT REM PT RETURN 9FT ADLT (ELECTROSURGICAL) ×2
ELECTRODE REM PT RTRN 9FT ADLT (ELECTROSURGICAL) ×1 IMPLANT
GLOVE BIOGEL PI IND STRL 6.5 (GLOVE) ×1 IMPLANT
GLOVE BIOGEL PI IND STRL 7.0 (GLOVE) ×1 IMPLANT
GLOVE BIOGEL PI INDICATOR 6.5 (GLOVE) ×1
GLOVE BIOGEL PI INDICATOR 7.0 (GLOVE) ×1
GLOVE SURG ENC TEXT LTX SZ6.5 (GLOVE) ×2 IMPLANT
GOWN STRL REUS W/ TWL LRG LVL3 (GOWN DISPOSABLE) ×2 IMPLANT
GOWN STRL REUS W/TWL LRG LVL3 (GOWN DISPOSABLE) ×4
HEMOSTAT SURGICEL 4X8 (HEMOSTASIS) IMPLANT
NS IRRIG 1000ML POUR BTL (IV SOLUTION) ×2 IMPLANT
PACK VAGINAL MINOR WOMEN LF (CUSTOM PROCEDURE TRAY) ×2 IMPLANT
PAD OB MATERNITY 4.3X12.25 (PERSONAL CARE ITEMS) ×2 IMPLANT
PENCIL BUTTON HOLSTER BLD 10FT (ELECTRODE) ×1 IMPLANT
PENCIL SMOKE EVACUATOR (MISCELLANEOUS) ×1 IMPLANT
SCOPETTES 8  STERILE (MISCELLANEOUS) ×2
SCOPETTES 8 STERILE (MISCELLANEOUS) ×1 IMPLANT
SPONGE SURGIFOAM ABS GEL 12-7 (HEMOSTASIS) IMPLANT
SUT VIC AB 0 CT1 27 (SUTURE) ×10
SUT VIC AB 0 CT1 27XBRD ANBCTR (SUTURE) ×3 IMPLANT
TOWEL GREEN STERILE FF (TOWEL DISPOSABLE) ×3 IMPLANT
TUBE CONNECTING 12X1/4 (SUCTIONS) ×2 IMPLANT
YANKAUER SUCT BULB TIP NO VENT (SUCTIONS) ×2 IMPLANT

## 2022-01-31 NOTE — Transfer of Care (Signed)
Immediate Anesthesia Transfer of Care Note  Patient: Karen Navarro  Procedure(s) Performed: CONIZATION CERVIX WITH BIOPSY (Cervix)  Patient Location: PACU  Anesthesia Type:General  Level of Consciousness: drowsy and patient cooperative  Airway & Oxygen Therapy: Patient Spontanous Breathing and Patient connected to nasal cannula oxygen  Post-op Assessment: Report given to RN and Post -op Vital signs reviewed and stable  Post vital signs: Reviewed and stable  Last Vitals:  Vitals Value Taken Time  BP 121/64 01/31/22 1331  Temp    Pulse 101 01/31/22 1332  Resp 21 01/31/22 1332  SpO2 98 % 01/31/22 1332  Vitals shown include unvalidated device data.  Last Pain:  Vitals:   01/31/22 1005  TempSrc:   PainSc: 0-No pain         Complications: No notable events documented.

## 2022-01-31 NOTE — Op Note (Signed)
01/31/2022  1:50 PM  PATIENT:  Karen Navarro  56 y.o. female  PRE-OPERATIVE DIAGNOSIS:  CIN II  POST-OPERATIVE DIAGNOSIS:  CIN II  PROCEDURE:  Procedure(s): CONIZATION CERVIX WITH BIOPSY (N/A)  SURGEON:  Surgeon(s) and Role:    Gerald Leitz, MD - Primary  PHYSICIAN ASSISTANT:   ASSISTANTS: none   ANESTHESIA:   general  EBL:  5 mL   BLOOD ADMINISTERED:none  DRAINS: none   LOCAL MEDICATIONS USED:  LIDOCAINE   SPECIMEN:  Source of Specimen:  Cervical Cone   DISPOSITION OF SPECIMEN:  PATHOLOGY  COUNTS:  YES  TOURNIQUET:  * No tourniquets in log *  DICTATION: .Note written in EPIC  PLAN OF CARE: Discharge to home after PACU  PATIENT DISPOSITION:  PACU - hemodynamically stable.   Delay start of Pharmacological VTE agent (>24hrs) due to surgical blood loss or risk of bleeding: not applicable  Findings: normal appearing external genitalia. Atrophic cervix. Minimal absorption of Lugol's most likely due to atrophy Normal vaginal mucosa.    Procedure. Pt was taken to operating room #4  [facility] hospital, where she was placed under general anesthesia. Time out was performed. She was prepped and draped in a sterile fashion. Speculum was placed. The anterior lip of the cervix was grasped with single toothed tenaculum.  10 cc of 1% lidocaine with epi was placed at the 4 and 8 o'clock positions. A suture of 0 vicryl was placed at the 3 and 9 o'clock positions and used for retraction. The single toothed tenaculum was removed. Lugol's  Was placed on the cervix with the findings noted above. Scalpel was used to excise a cone portion of the cervix. The specimen was marked with a suture at the 12 o'clock position and sent to pathology. Hemostasis was obtained with roller ball and Monsel. surgicel was placed in the wound bed.  Excellent hemostasis was noted.  Sponge lap and needle counts were correct x 2. Pt was awakened from anesthesia and taken to the recovery room in stable  condition.

## 2022-01-31 NOTE — H&P (Signed)
Date of Initial H&P: 01/31/2022  History reviewed, patient examined, no change in status, stable for surgery.  

## 2022-01-31 NOTE — Anesthesia Postprocedure Evaluation (Signed)
Anesthesia Post Note  Patient: Karen Navarro  Procedure(s) Performed: CONIZATION CERVIX WITH BIOPSY (Cervix)     Patient location during evaluation: PACU Anesthesia Type: General Level of consciousness: awake and alert Pain management: pain level controlled Vital Signs Assessment: post-procedure vital signs reviewed and stable Respiratory status: spontaneous breathing, nonlabored ventilation and respiratory function stable Cardiovascular status: blood pressure returned to baseline and stable Postop Assessment: no apparent nausea or vomiting Anesthetic complications: no   No notable events documented.  Last Vitals:  Vitals:   01/31/22 1330 01/31/22 1400  BP: 121/64 140/75  Pulse: 99 99  Resp: 20 20  Temp: 36.6 C 36.6 C  SpO2: 98% 99%    Last Pain:  Vitals:   01/31/22 1400  TempSrc:   PainSc: 0-No pain                 Lowella Curb

## 2022-01-31 NOTE — Anesthesia Procedure Notes (Signed)
Procedure Name: LMA Insertion Date/Time: 01/31/2022 12:44 PM  Performed by: Shary Decamp, CRNAPre-anesthesia Checklist: Patient identified, Patient being monitored, Timeout performed, Emergency Drugs available and Suction available Patient Re-evaluated:Patient Re-evaluated prior to induction Oxygen Delivery Method: Circle system utilized Preoxygenation: Pre-oxygenation with 100% oxygen Induction Type: IV induction Ventilation: Mask ventilation without difficulty LMA: LMA inserted LMA Size: 4.0 Number of attempts: 1 Placement Confirmation: positive ETCO2 and breath sounds checked- equal and bilateral Tube secured with: Tape Dental Injury: Teeth and Oropharynx as per pre-operative assessment

## 2022-01-31 NOTE — Anesthesia Preprocedure Evaluation (Signed)
Anesthesia Evaluation  Patient identified by MRN, date of birth, ID band Patient awake    Reviewed: Allergy & Precautions, NPO status , Patient's Chart, lab work & pertinent test results  Airway Mallampati: II  TM Distance: >3 FB Neck ROM: Full    Dental no notable dental hx.    Pulmonary neg pulmonary ROS,    Pulmonary exam normal breath sounds clear to auscultation       Cardiovascular negative cardio ROS Normal cardiovascular exam Rhythm:Regular Rate:Normal     Neuro/Psych negative neurological ROS  negative psych ROS   GI/Hepatic negative GI ROS, Neg liver ROS,   Endo/Other  negative endocrine ROS  Renal/GU negative Renal ROS  negative genitourinary   Musculoskeletal  (+) Arthritis , Osteoarthritis,    Abdominal   Peds negative pediatric ROS (+)  Hematology negative hematology ROS (+)   Anesthesia Other Findings   Reproductive/Obstetrics negative OB ROS                             Anesthesia Physical Anesthesia Plan  ASA: 2  Anesthesia Plan: General   Post-op Pain Management: Minimal or no pain anticipated   Induction: Intravenous  PONV Risk Score and Plan: 3 and Ondansetron, Dexamethasone, Midazolam and Treatment may vary due to age or medical condition  Airway Management Planned: LMA  Additional Equipment:   Intra-op Plan:   Post-operative Plan: Extubation in OR  Informed Consent: I have reviewed the patients History and Physical, chart, labs and discussed the procedure including the risks, benefits and alternatives for the proposed anesthesia with the patient or authorized representative who has indicated his/her understanding and acceptance.     Dental advisory given  Plan Discussed with: CRNA  Anesthesia Plan Comments:         Anesthesia Quick Evaluation

## 2022-01-31 NOTE — H&P (Signed)
Reason for Appointment   1. PreOp for 01/31/22       History of Present Illness  General:          56 y/o presents for preop visit. Pt is schedule for a cold knife conizaton on 01/31/2022 for the managment of CIN II.         HISTORY SIGNIFICANT TO:        Pt had colposcopy on 12/11/2021. Colposcopy results revealed CIN II (high grade cervical dysplasia) at 3 o'clock, 6 o'clock, and ECC... CIN I at 9 o'clock.         Pap smear: 11/07/2021 revealed LSIL and HRHPV+ (Neg for 93,81,01).     Current Medications  Taking  Biotin 1000 MCG Tablet 1 tablet Orally Once a day    Calcium + D3     valACYclovir HCl 500 MG Tablet TAKE 1 TABLET BY MOUTH EVERY 12 HOURS AS NEEDED     Xyzal Allergy 24HR(Levocetirizine Dihydrochloride) 5 MG Tablet 1 tablet in the evening Orally Once a day    Vitamin B12 100 MCG Tablet as directed Orally     Multiple Vitamin - Tablet 1 tablet Orally Once a day, Notes: OTC    Fish Oil ? Capsule 1 capsule Orally Once a day, Notes: OTC    Medication List reviewed and reconciled with the patient         Past Medical History        HSV.         PMDD .         Hematuria worked up by Dr. Aldean Ast.         Torn retina- 01/15/2022.        Surgical History         moles removed          Colonoscopy 10/25/2014         torn retina repair 12/2021       Family History   Father: deceased, hypertension, diagnosed with Hypertension   Mother: deceased, sarcoma   Paternal Grand Mother: cancer, breast   Brother 1: alive   Brother2: alive, crohns disease   Sister 1: alive   2 brother(s) , 1 sister(s) . 1 son(s) , 2 daughter(s) .    negative for colon cancer, colon polyps or liver disease- brother with crohn\'s disease.       Social History  General:   Tobacco use  cigarettes: Former smoker only smoked one year while in college, Quit in year 1985, Tobacco history last updated 01/25/2022, Vaping No. no EXPOSURE TO PASSIVE SMOKE. Alcohol: yes,  social. Caffeine: yes, tea , soda. no Recreational drug use. Exercise: minimal. Marital Status: Divorced. Children: 1, Boys, 2, girls. OCCUPATION: employed, Set designer.      Gyn History  Sexual activity currently sexually active.   Periods : postmenopausal.   LMP ~age 40, No PMB.   Denies H/O Birth control none.   Last pap smear date 11/07/2021- LSIL, HRHPV+ (non 16,18,45).   Last mammogram date 09/2021.   Abnormal pap smear HPV +, 11/07/2021- LSIL, HRHPV+ (non 16,18,45).   STD HSV, HPV.   Menarche 10.       OB History  Number of pregnancies 4.   Pregnancy # 1 miscarriage.   Pregnancy # 2 live birth, vaginal delivery,, boy.   Pregnancy # 3 live birth, vaginal delivery, girl.   Pregnancy # 4: live birth, vaginal delivery, girl.       Allergies   Sulfa drugs (  for allergy): anaphylaxis       Hospitalization/Major Diagnostic Procedure   childbirth x 3 SVD        Review of Systems  CONSTITUTIONAL:         Chills No. Fatigue No. Fever No. Night sweats No. Recent travel outside [place] No. Sweats No. Weight change No.     OPHTHALMOLOGY:         Blurring of vision no. Change in vision no. Double vision no.     ENT:         Dizziness no. Nose bleeds no. Sore throat no. Teeth pain no.     ALLERGY:         Hives no.     CARDIOLOGY:         Chest pain no. High blood pressure no. Irregular heart beat no. Leg edema no. Palpitations no.     RESPIRATORY:         Shortness of breath no. Cough no. Wheezing no.     UROLOGY:         Pain with urination no. Urinary urgency no. Urinary frequency no. Urinary incontinence no. Difficulty urinating No. Blood in urine No.     GASTROENTEROLOGY:         Abdominal pain no. Appetite change no. Bloating/belching no. Blood in stool or on toilet paper no. Change in bowel movements no. Constipation no. Diarrhea no. Difficulty swallowing no. Nausea no.     FEMALE REPRODUCTIVE:         Vulvar pain no. Vulvar rash no. Abnormal vaginal bleeding  no. Breast pain no. Nipple discharge no. Pain with intercourse no. Pelvic pain no. Unusual vaginal discharge no. Vaginal itching no.     MUSCULOSKELETAL:         Muscle aches no.     NEUROLOGY:         Headache no. Tingling/numbness no. Weakness no.     PSYCHOLOGY:         Depression no. Anxiety no. Nervousness no. Sleep disturbances no. Suicidal ideation no .     ENDOCRINOLOGY:         Excessive thirst no. Excessive urination no. Hair loss no. Heat or cold intolerance no.     HEMATOLOGY/LYMPH:         Abnormal bleeding no. Easy bruising no. Swollen glands no.     DERMATOLOGY:         New/changing skin lesion no. Rash no. Sores no.         Vital Signs  Wt 155.8, Wt change 1.4 lbs, Ht 66, BMI 25.14, Pulse sitting 88, BP sitting 127/76.     Examination  General Examination:       CONSTITUTIONAL: alert, oriented, NAD . SKIN:  moist, warm. EYES:  Conjunctiva clear. LUNGS:  good I:E efffort noted, clear to auscultation bilaterally. HEART:  regular rate and rhythm. ABDOMEN: soft, non-tender/non-distended, bowel sounds present . FEMALE GENITOURINARY: normal external genitalia, labia - unremarkable, vagina - pink moist mucosa, no lesions or abnormal discharge, cervix - no discharge or lesions or CMT, adnexa - no masses or tenderness, uterus - nontender and normal size on palpation . PSYCH:  affect normal, good eye contact.       Physical Examination  Chaperone present:         Chaperone present  Debby Freiberg 01/25/2022 02:49:35 PM > , for pelvic exam.            Assessments     1. 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 CIN II (cervical intraepithelial neoplasia II)   Notes: Pt is scheduled for cold knife conizationfor management of CIN II. Discussed w/ pt less than 3% risks of cold knife conization including but not limited to infection/bleeding, possible perforation of uterus, with the need for further surgery. Discussed risk of blood  transfusion and risk of HIV or hep B&C (1 out of 2 million and 1 out of 200,000, respectively) with blood transfusion. Pt is aware of risks and desires blood transfusion if needed. Pt advised to avoid NSAIDs (Asprin, Aleve, Advil, Ibuprofen, Motrin) from now until surgery given risk of bleeding during surgery. She is advised to avoid eating or drinking starting midnight prior to surgery. Pt is advised that she may have watery discharge after surgery. Discussed post-surgery avoidance of driving for 24 hrs and avoidance of intercourse for up to 4 weeks after CKC. Follow up 4 wks after surgery for post-op visit.Marland Kitchen

## 2022-01-31 NOTE — H&P (Deleted)
  The note originally documented on this encounter has been moved the the encounter in which it belongs.  

## 2022-02-01 ENCOUNTER — Encounter (HOSPITAL_COMMUNITY): Payer: Self-pay | Admitting: Obstetrics and Gynecology

## 2022-02-02 LAB — SURGICAL PATHOLOGY

## 2022-02-26 ENCOUNTER — Encounter (INDEPENDENT_AMBULATORY_CARE_PROVIDER_SITE_OTHER): Payer: Self-pay | Admitting: Ophthalmology

## 2022-02-26 ENCOUNTER — Encounter (INDEPENDENT_AMBULATORY_CARE_PROVIDER_SITE_OTHER): Payer: BC Managed Care – PPO | Admitting: Ophthalmology

## 2022-02-26 ENCOUNTER — Ambulatory Visit (INDEPENDENT_AMBULATORY_CARE_PROVIDER_SITE_OTHER): Payer: BC Managed Care – PPO | Admitting: Ophthalmology

## 2022-02-26 DIAGNOSIS — H33312 Horseshoe tear of retina without detachment, left eye: Secondary | ICD-10-CM

## 2022-02-26 DIAGNOSIS — H43812 Vitreous degeneration, left eye: Secondary | ICD-10-CM

## 2022-02-26 DIAGNOSIS — H2513 Age-related nuclear cataract, bilateral: Secondary | ICD-10-CM

## 2022-02-26 DIAGNOSIS — H4312 Vitreous hemorrhage, left eye: Secondary | ICD-10-CM

## 2022-02-26 NOTE — Assessment & Plan Note (Signed)
Clearing over time.

## 2022-02-26 NOTE — Assessment & Plan Note (Signed)
Accounts for persistent floaters  The nature of posterior vitreous detachment was discussed with the patient as well as its physiology, its age prevalence, and its possible implication regarding retinal breaks and detachment.  An informational brochure was offered to the patient.  All the patient's questions were answered.  The patient was asked to return if new or different flashes or floaters develops.   Patient was instructed to contact office immediately if any new changes were noticed. I explained to the patient that vitreous inside the eye is similar to jello inside a bowl. As the jello melts it can start to pull away from the bowl, similarly the vitreous throughout our lives can begin to pull away from the retina. That process is called a posterior vitreous detachment. In some cases, the vitreous can tug hard enough on the retina to form a retinal tear. I discussed with the patient the signs and symptoms of a retinal detachment.  Do not rub the eye.

## 2022-02-26 NOTE — Assessment & Plan Note (Signed)
Good laser retinopexy delivered, 01-15-2022

## 2022-02-26 NOTE — Assessment & Plan Note (Signed)
Minor follow-up Dr. Lucretia Roers

## 2022-02-26 NOTE — Progress Notes (Signed)
02/26/2022     CHIEF COMPLAINT Patient presents for  Chief Complaint  Patient presents with   Post-op Follow-up   Laser retinopexy for retinal tear left eye, temporally   HISTORY OF PRESENT ILLNESS: Karen Navarro is a 56 y.o. female who presents to the clinic today for:   HPI     Post-op Follow-up           Laterality: left eye   Discomfort: floaters.  Negative for pain, itching, foreign body sensation, tearing, discharge and none   Vision: is stable         Comments   6 weeks for DILATE OS, COLOR FP, POST OP. Pt stated vision has been stable since last visit. Pt confirms FOL and floaters.       Last edited by Angeline Slim on 02/26/2022  1:39 PM.      Referring physician: Marzella Schlein., MD [address] [place],  [place] [address]  HISTORICAL INFORMATION:   Selected notes from the MEDICAL RECORD NUMBER       CURRENT MEDICATIONS: No current outpatient medications on file. (Ophthalmic Drugs)   No current facility-administered medications for this visit. (Ophthalmic Drugs)   Current Outpatient Medications (Other)  Medication Sig   Biotin 5000 MCG TABS Take 5,000 mcg by mouth daily.   Calcium Carb-Cholecalciferol (CALCIUM 600+D) 600-20 MG-MCG TABS Take 1 tablet by mouth daily.   erythromycin with ethanol (THERAMYCIN) 2 % external solution Apply 1 Application topically daily as needed (acne).   famotidine (PEPCID) 20 MG tablet Take 1 tablet twice a day for reflux. (Patient not taking: Reported on 01/25/2022)   fluticasone (FLONASE) 50 MCG/ACT nasal spray Use 1 -2 spray per nostril twice a day. (Patient taking differently: Place 1 spray into both nostrils daily as needed for allergies or rhinitis.)   ibuprofen (ADVIL) 800 MG tablet Take 1 tablet (800 mg total) by mouth every 8 (eight) hours as needed.   levocetirizine (XYZAL) 5 MG tablet Take 1 tablet as needed. (Patient taking differently: Take 5 mg by mouth every morning.)   meclizine (ANTIVERT) 25 MG  tablet Take 1 tablet (25 mg total) by mouth 3 (three) times daily as needed for dizziness.   Multiple Vitamin tablet Take 1 tablet by mouth daily. 50 +   Omega-3 Fatty Acids (FISH OIL) 1200 MG CAPS Take 1,200 mg by mouth daily.   oxyCODONE (OXY IR/ROXICODONE) 5 MG immediate release tablet Take 1 tablet (5 mg total) by mouth every 6 (six) hours as needed for severe pain.   oxyCODONE (OXY IR/ROXICODONE) 5 MG immediate release tablet Take 1 tablet (5 mg total) by mouth every 6 (six) hours as needed (for pain score of 1-4).   valACYclovir (VALTREX) 500 MG tablet Take 500 mg by mouth daily as needed (Breakout).   No current facility-administered medications for this visit. (Other)      REVIEW OF SYSTEMS: ROS   Negative for: Constitutional, Gastrointestinal, Neurological, Skin, Genitourinary, Musculoskeletal, HENT, Endocrine, Cardiovascular, Eyes, Respiratory, Psychiatric, Allergic/Imm, Heme/Lymph Last edited by Angeline Slim on 02/26/2022  1:37 PM.       ALLERGIES Allergies  Allergen Reactions   Sulfa Antibiotics Swelling    PAST MEDICAL HISTORY Past Medical History:  Diagnosis Date   Arthritis    Chicken pox    Multiple allergies    Vertigo    Past Surgical History:  Procedure Laterality Date   CERVICAL CONIZATION W/BX N/A 01/31/2022   Procedure: CONIZATION CERVIX WITH BIOPSY;  Surgeon: Gerald Leitz, MD;  Location: [facility] OR;  Service: Gynecology;  Laterality: N/A;   COLONOSCOPY     LIPOMA EXCISION     Back    FAMILY HISTORY Family History  Problem Relation Age of Onset   Asthma Sister    Allergic rhinitis Sister    Allergic rhinitis Brother     SOCIAL HISTORY Social History   Tobacco Use   Smoking status: Never   Smokeless tobacco: Never  Vaping Use   Vaping Use: Never used  Substance Use Topics   Alcohol use: Yes    Comment: weekends   Drug use: No         OPHTHALMIC EXAM:  Base Eye Exam     Visual Acuity (ETDRS)       Right Left   Dist cc 20/20 20/20 -1     Correction: Contacts         Tonometry (Tonopen, 1:44 PM)       Right Left   Pressure 12 13         Pupils       Pupils APD   Right PERRL None   Left PERRL None         Visual Fields       Left Right    Full Full         Extraocular Movement       Right Left    Full, Ortho Full, Ortho         Neuro/Psych     Oriented x3: Yes         Dilation     Left eye: 2.5% Phenylephrine, 1.0% Mydriacyl @ 1:44 PM           Slit Lamp and Fundus Exam     External Exam       Right Left   External Normal Normal         Slit Lamp Exam       Right Left   Lids/Lashes Normal Normal   Conjunctiva/Sclera White and quiet White and quiet   Cornea Clear Clear   Anterior Chamber Deep and quiet Deep and quiet   Iris Round and reactive Round and reactive   Lens Nuclear sclerosis Nuclear sclerosis   Anterior Vitreous Normal Normal         Fundus Exam       Right Left   Posterior Vitreous  Small preretinal hemorrhage inferiorly   Disc  Normal   C/D Ratio  0.4   Macula  Normal   Vessels  Normal   Periphery  Traction at 9:00 anterior to the equator of straddle retinal vein with hemorrhage on either side with thin attenuated hole, at 530 meridian pigmentary change with attenuated hole at the ora serrata, 25 D, 20 D, good pexy, no new breaks            IMAGING AND PROCEDURES  Imaging and Procedures for 02/26/22  Color Fundus Photography Optos - OU - Both Eyes       Right Eye Progression has been stable. Disc findings include normal observations. Macula : normal observations. Vessels : normal observations. Periphery : normal observations.   Left Eye Progression has been stable.   Notes PVD OS, with vitreous debris             ASSESSMENT/PLAN:  Nuclear sclerotic cataract of both eyes Minor follow-up Dr. Lucretia Roers  Retinal tear, left Good laser retinopexy delivered, 01-15-2022  Vitreous hemorrhage, left eye (HCC) Clearing over  time.  Posterior vitreous detachment, left eye Accounts for persistent floaters  The nature of posterior vitreous detachment was discussed with the patient as well as its physiology, its age prevalence, and its possible implication regarding retinal breaks and detachment.  An informational brochure was offered to the patient.  All the patient's questions were answered.  The patient was asked to return if new or different flashes or floaters develops.   Patient was instructed to contact office immediately if any new changes were noticed. I explained to the patient that vitreous inside the eye is similar to jello inside a bowl. As the jello melts it can start to pull away from the bowl, similarly the vitreous throughout our lives can begin to pull away from the retina. That process is called a posterior vitreous detachment. In some cases, the vitreous can tug hard enough on the retina to form a retinal tear. I discussed with the patient the signs and symptoms of a retinal detachment.  Do not rub the eye.       ICD-10-CM   1. Retinal tear, left  H33.312 Color Fundus Photography Optos - OU - Both Eyes    2. Nuclear sclerotic cataract of both eyes  H25.13     3. Vitreous hemorrhage, left eye (HCC)  H43.12     4. Posterior vitreous detachment, left eye  H43.812       1.  OS looks great, no new breaks over time.  Patient informed to contact the office promptly for new profound worsening of symptoms.  2.  Follow-up Dr. Lucretia Roers as scheduled  3.  Ophthalmic Meds Ordered this visit:  No orders of the defined types were placed in this encounter.      No follow-ups on file.  There are no Patient Instructions on file for this visit.   Explained the diagnoses, plan, and follow up with the patient and they expressed understanding.  Patient expressed understanding of the importance of proper follow up care.   Alford Highland Orvile Corona M.D. Diseases & Surgery of the Retina [facility] 02/26/22     Abbreviations: M myopia (nearsighted); A astigmatism; H hyperopia (farsighted); P presbyopia; Mrx spectacle prescription;  CTL contact lenses; OD right eye; OS left eye; OU both eyes  XT exotropia; ET esotropia; PEK punctate epithelial keratitis; PEE punctate epithelial erosions; DES dry eye syndrome; MGD meibomian gland dysfunction; ATs artificial tears; PFAT's preservative free artificial tears; NSC nuclear sclerotic cataract; PSC posterior subcapsular cataract; ERM epi-retinal membrane; PVD posterior vitreous detachment; RD retinal detachment; DM diabetes mellitus; DR diabetic retinopathy; NPDR non-proliferative diabetic retinopathy; PDR proliferative diabetic retinopathy; CSME clinically significant macular edema; DME diabetic macular edema; dbh dot blot hemorrhages; CWS cotton wool spot; POAG primary open angle glaucoma; C/D cup-to-disc ratio; HVF humphrey visual field; GVF goldmann visual field; OCT optical coherence tomography; IOP intraocular pressure; BRVO Branch retinal vein occlusion; CRVO central retinal vein occlusion; CRAO central retinal artery occlusion; BRAO branch retinal artery occlusion; RT retinal tear; SB scleral buckle; PPV pars plana vitrectomy; VH Vitreous hemorrhage; PRP panretinal laser photocoagulation; IVK intravitreal kenalog; VMT vitreomacular traction; MH Macular hole;  NVD neovascularization of the disc; NVE neovascularization elsewhere; AREDS age related eye disease study; ARMD age related macular degeneration; POAG primary open angle glaucoma; EBMD epithelial/anterior basement membrane dystrophy; ACIOL anterior chamber intraocular lens; IOL intraocular lens; PCIOL posterior chamber intraocular lens; Phaco/IOL phacoemulsification with intraocular lens placement; PRK photorefractive keratectomy; LASIK laser assisted in situ keratomileusis; HTN hypertension; DM  diabetes mellitus; COPD chronic obstructive pulmonary disease

## 2022-03-05 DIAGNOSIS — M19041 Primary osteoarthritis, right hand: Secondary | ICD-10-CM | POA: Diagnosis not present

## 2022-04-11 ENCOUNTER — Other Ambulatory Visit: Payer: Self-pay | Admitting: Allergy

## 2022-04-11 ENCOUNTER — Other Ambulatory Visit: Payer: Self-pay

## 2022-04-11 NOTE — Telephone Encounter (Signed)
[place] communication from [facility] [phone] denied levocetrizine 5mg  courtesy refill already given and patient last seen 11/30/2020

## 2022-05-02 DIAGNOSIS — M180 Bilateral primary osteoarthritis of first carpometacarpal joints: Secondary | ICD-10-CM | POA: Diagnosis not present

## 2022-05-02 DIAGNOSIS — M19041 Primary osteoarthritis, right hand: Secondary | ICD-10-CM | POA: Diagnosis not present

## 2022-05-28 DIAGNOSIS — N871 Moderate cervical dysplasia: Secondary | ICD-10-CM | POA: Diagnosis not present

## 2022-05-30 ENCOUNTER — Encounter (HOSPITAL_BASED_OUTPATIENT_CLINIC_OR_DEPARTMENT_OTHER): Payer: Self-pay | Admitting: Obstetrics and Gynecology

## 2022-06-04 ENCOUNTER — Encounter (HOSPITAL_BASED_OUTPATIENT_CLINIC_OR_DEPARTMENT_OTHER): Payer: Self-pay | Admitting: Obstetrics and Gynecology

## 2022-06-04 ENCOUNTER — Other Ambulatory Visit: Payer: Self-pay

## 2022-06-04 NOTE — Progress Notes (Signed)
Spoke w/ via phone for pre-op interview---[facility] needs dos---- none per anesthesia, surgeon orders pending as of 06/04/22              Lab results------06/08/22 lab appt for cbc, type & screen, 12/09/21 EKG in chart & Epic COVID test -----patient states asymptomatic no test needed Arrive at -------0530 on Tuesday, 06/12/22 NPO after MN NO Solid Food.  Clear liquids from MN until---0430 Med rec completed Medications to take morning of surgery -----Xyzal prn, Meclizine prn, Valtrex prn Diabetic medication -----n/a Patient instructed no nail polish to be worn day of surgery Patient instructed to bring photo id and insurance card day of surgery Patient aware to have Driver (ride ) / caregiver    for 24 hours after surgery  Patient Special Instructions -----Extended / overnight instructions given. Pre-Op special Istructions -----Requested orders from Dr. Richardson Dopp via Epic IB on 05/30/22 Patient verbalized understanding of instructions that were given at this phone interview. Patient denies shortness of breath, chest pain, fever, cough at this phone interview.

## 2022-06-04 NOTE — Progress Notes (Signed)
Your procedure is scheduled on Tuesday, 06/12/22.  Report to [facility] [place] AT  5:30 A. M.   Call this number if you have problems the morning of surgery  [phone].   OUR ADDRESS IS [address].  WE ARE LOCATED IN [facility].  PLEASE BRING YOUR INSURANCE CARD AND PHOTO ID DAY OF SURGERY.  ONLY 2 PEOPLE ARE ALLOWED IN  WAITING  ROOM.                                      REMEMBER:  DO NOT EAT FOOD, CANDY GUM OR MINTS  AFTER MIDNIGHT THE NIGHT BEFORE YOUR SURGERY . YOU MAY HAVE CLEAR LIQUIDS FROM MIDNIGHT THE NIGHT BEFORE YOUR SURGERY UNTIL  4:30 AM. NO CLEAR LIQUIDS AFTER   4:30 AM DAY OF SURGERY.  YOU MAY  BRUSH YOUR TEETH MORNING OF SURGERY AND RINSE YOUR MOUTH OUT, NO CHEWING GUM CANDY OR MINTS.     CLEAR LIQUID DIET   Foods Allowed                                                                     Foods Excluded  Coffee and tea, regular and decaf                             liquids that you cannot  Plain Jell-O                                                                   see through such as: Fruit ices (not with fruit pulp)                                     milk, soups, orange juice  Plain  Popsicles                                    All solid food Carbonated beverages, regular and diet                                    Cranberry, grape and apple juices Sports drinks like Gatorade _____________________________________________________________________     TAKE ONLY THESE MEDICATIONS MORNING OF SURGERY:  Xyzal if needed, Meclizine if needed, Valtrex if needed    UP TO 4 VISITORS  MAY VISIT IN THE EXTENDED RECOVERY ROOM UNTIL 800 PM ONLY.  ONE  VISITOR AGE 80 AND OVER MAY SPEND THE NIGHT AND MUST BE IN EXTENDED RECOVERY ROOM NO LATER THAN 800 PM . YOUR DISCHARGE TIME AFTER YOU SPEND THE NIGHT IS 900 AM THE MORNING AFTER YOUR SURGERY.  YOU MAY PACK A  SMALL OVERNIGHT BAG WITH TOILETRIES FOR YOUR OVERNIGHT STAY IF YOU  WISH.  YOUR PRESCRIPTION MEDICATIONS WILL BE PROVIDED DURING [place].                                      DO NOT WEAR JEWERLY, MAKE UP. DO NOT WEAR LOTIONS, POWDERS, PERFUMES OR NAIL POLISH ON YOUR FINGERNAILS. TOENAIL POLISH IS OK TO WEAR. DO NOT SHAVE FOR 48 HOURS PRIOR TO DAY OF SURGERY. MEN MAY SHAVE FACE AND NECK. CONTACTS, GLASSES, OR DENTURES MAY NOT BE WORN TO SURGERY.  REMEMBER: NO SMOKING, DRUGS OR ALCOHOL FOR 24 HOURS BEFORE YOUR SURGERY.                                    [facility] IS NOT RESPONSIBLE  FOR ANY BELONGINGS.                                                                    Marland Kitchen           [facility] - Preparing for Surgery Before surgery, you can play an important role.  Because skin is not sterile, your skin needs to be as free of germs as possible.  You can reduce the number of germs on your skin by washing with CHG (chlorahexidine gluconate) soap before surgery.  CHG is an antiseptic cleaner which kills germs and bonds with the skin to continue killing germs even after washing. Please DO NOT use if you have an allergy to CHG or antibacterial soaps.  If your skin becomes reddened/irritated stop using the CHG and inform your nurse when you arrive at Short Stay. Do not shave (including legs and underarms) for at least 48 hours prior to the first CHG shower.  You may shave your face/neck. Please follow these instructions carefully:  1.  Shower with CHG Soap the night before surgery and the  morning of Surgery.  2.  If you choose to wash your hair, wash your hair first as usual with your  normal  shampoo.  3.  After you shampoo, rinse your hair and body thoroughly to remove the  shampoo.                                        4.  Use CHG as you would any other liquid soap.  You can apply chg directly  to the skin and wash , chg soap provided, night before and morning of your surgery.  5.  Apply the CHG Soap to your body ONLY FROM THE NECK DOWN.   Do not use  on face/ open                           Wound or open sores. Avoid contact with eyes, ears mouth and genitals (private parts).                       Wash face,  Genitals (private parts) with your normal soap.             6.  Wash thoroughly, paying special attention to the area where your surgery  will be performed.  7.  Thoroughly rinse your body with warm water from the neck down.  8.  DO NOT shower/wash with your normal soap after using and rinsing off  the CHG Soap.             9.  Pat yourself dry with a clean towel.            10.  Wear clean pajamas.            11.  Place clean sheets on your bed the night of your first shower and do not  sleep with pets. Day of Surgery : Do not apply any lotions/deodorants the morning of surgery.  Please wear clean clothes to the hospital/surgery center.  IF YOU HAVE ANY SKIN IRRITATION OR PROBLEMS WITH THE SURGICAL SOAP, PLEASE GET A BAR OF GOLD DIAL SOAP AND SHOWER THE NIGHT BEFORE YOUR SURGERY AND THE MORNING OF YOUR SURGERY. PLEASE LET THE NURSE KNOW MORNING OF YOUR SURGERY IF YOU HAD ANY PROBLEMS WITH THE SURGICAL SOAP.   ________________________________________________________________________                                                        QUESTIONS [facility] Falling PRE OP NURSE PHONE [phone].

## 2022-06-08 ENCOUNTER — Encounter (HOSPITAL_COMMUNITY)
Admission: RE | Admit: 2022-06-08 | Discharge: 2022-06-08 | Disposition: A | Discharge status: Home / Self Care | Payer: BC Managed Care – PPO | Source: Ambulatory Visit | Attending: Obstetrics and Gynecology | Admitting: Obstetrics and Gynecology

## 2022-06-08 ENCOUNTER — Other Ambulatory Visit: Payer: Self-pay | Admitting: Obstetrics and Gynecology

## 2022-06-08 ENCOUNTER — Encounter (HOSPITAL_COMMUNITY): Payer: Self-pay

## 2022-06-08 DIAGNOSIS — Z01812 Encounter for preprocedural laboratory examination: Secondary | ICD-10-CM | POA: Insufficient documentation

## 2022-06-08 DIAGNOSIS — Z01818 Encounter for other preprocedural examination: Secondary | ICD-10-CM

## 2022-06-08 DIAGNOSIS — N879 Dysplasia of cervix uteri, unspecified: Secondary | ICD-10-CM

## 2022-06-08 LAB — CBC
HCT: 43.3 % (ref 36.0–46.0)
Hemoglobin: 13.8 g/dL (ref 12.0–15.0)
MCH: 30.1 pg (ref 26.0–34.0)
MCHC: 31.9 g/dL (ref 30.0–36.0)
MCV: 94.3 fL (ref 80.0–100.0)
Platelets: 271 10*3/uL (ref 150–400)
RBC: 4.59 MIL/uL (ref 3.87–5.11)
RDW: 13.3 % (ref 11.5–15.5)
WBC: 4.9 10*3/uL (ref 4.0–10.5)
nRBC: 0 % (ref 0.0–0.2)

## 2022-06-11 ENCOUNTER — Other Ambulatory Visit: Payer: Self-pay | Admitting: Obstetrics and Gynecology

## 2022-06-11 NOTE — H&P (Signed)
  The note originally documented on this encounter has been moved the the encounter in which it belongs.    C were discussed. She voiced understanding and desires to proceed with robotic assisted laparoscopic hysterorectomy with bilateral salpingo-oophorectomy.

## 2022-06-11 NOTE — H&P (Signed)
  The note originally documented on this encounter has been moved the the encounter in which it belongs.  

## 2022-06-11 NOTE — Anesthesia Preprocedure Evaluation (Signed)
Anesthesia Evaluation  Patient identified by MRN, date of birth, ID band Patient awake    Reviewed: Allergy & Precautions, H&P , NPO status , Patient's Chart, lab work & pertinent test results  Airway Mallampati: I  TM Distance: >3 FB Neck ROM: Full    Dental no notable dental hx. (+) Teeth Intact, Dental Advisory Given, Caps   Pulmonary neg pulmonary ROS   Pulmonary exam normal breath sounds clear to auscultation       Cardiovascular negative cardio ROS Normal cardiovascular exam Rhythm:Regular Rate:Normal     Neuro/Psych  Neuromuscular disease negative neurological ROS  negative psych ROS   GI/Hepatic negative GI ROS, Neg liver ROS,GERD  Controlled,,  Endo/Other  negative endocrine ROS    Renal/GU negative Renal ROS  negative genitourinary   Musculoskeletal negative musculoskeletal ROS (+) Arthritis , Osteoarthritis,    Abdominal   Peds negative pediatric ROS (+)  Hematology negative hematology ROS (+)   Anesthesia Other Findings   Reproductive/Obstetrics negative OB ROS                             Anesthesia Physical Anesthesia Plan  ASA: 2  Anesthesia Plan: General   Post-op Pain Management: Minimal or no pain anticipated   Induction: Intravenous  PONV Risk Score and Plan: 3 and Ondansetron, Dexamethasone, Midazolam and Treatment may vary due to age or medical condition  Airway Management Planned: Oral ETT  Additional Equipment: None  Intra-op Plan:   Post-operative Plan: Extubation in OR  Informed Consent: I have reviewed the patients History and Physical, chart, labs and discussed the procedure including the risks, benefits and alternatives for the proposed anesthesia with the patient or authorized representative who has indicated his/her understanding and acceptance.     Dental advisory given  Plan Discussed with: CRNA and Anesthesiologist  Anesthesia Plan  Comments:         Anesthesia Quick Evaluation

## 2022-06-12 ENCOUNTER — Encounter (HOSPITAL_BASED_OUTPATIENT_CLINIC_OR_DEPARTMENT_OTHER)
Admission: RE | Disposition: A | Discharge status: Home / Self Care | Payer: Self-pay | Source: Ambulatory Visit | Attending: Obstetrics and Gynecology

## 2022-06-12 ENCOUNTER — Other Ambulatory Visit: Payer: Self-pay

## 2022-06-12 ENCOUNTER — Encounter (HOSPITAL_BASED_OUTPATIENT_CLINIC_OR_DEPARTMENT_OTHER): Payer: Self-pay | Admitting: Obstetrics and Gynecology

## 2022-06-12 ENCOUNTER — Ambulatory Visit (HOSPITAL_BASED_OUTPATIENT_CLINIC_OR_DEPARTMENT_OTHER): Payer: BC Managed Care – PPO | Admitting: Anesthesiology

## 2022-06-12 ENCOUNTER — Observation Stay (HOSPITAL_BASED_OUTPATIENT_CLINIC_OR_DEPARTMENT_OTHER)
Admission: RE | Admit: 2022-06-12 | Discharge: 2022-06-12 | Disposition: A | Discharge status: Home / Self Care | Payer: BC Managed Care – PPO | Source: Ambulatory Visit | Attending: Obstetrics and Gynecology | Admitting: Obstetrics and Gynecology

## 2022-06-12 DIAGNOSIS — Z87891 Personal history of nicotine dependence: Secondary | ICD-10-CM | POA: Diagnosis not present

## 2022-06-12 DIAGNOSIS — D069 Carcinoma in situ of cervix, unspecified: Secondary | ICD-10-CM | POA: Diagnosis not present

## 2022-06-12 DIAGNOSIS — N8003 Adenomyosis of the uterus: Secondary | ICD-10-CM | POA: Diagnosis not present

## 2022-06-12 DIAGNOSIS — Z01818 Encounter for other preprocedural examination: Secondary | ICD-10-CM

## 2022-06-12 DIAGNOSIS — N879 Dysplasia of cervix uteri, unspecified: Secondary | ICD-10-CM | POA: Diagnosis present

## 2022-06-12 DIAGNOSIS — Z90710 Acquired absence of both cervix and uterus: Secondary | ICD-10-CM | POA: Diagnosis present

## 2022-06-12 DIAGNOSIS — N871 Moderate cervical dysplasia: Principal | ICD-10-CM | POA: Insufficient documentation

## 2022-06-12 HISTORY — DX: Gastro-esophageal reflux disease without esophagitis: K21.9

## 2022-06-12 HISTORY — DX: Presence of spectacles and contact lenses: Z97.3

## 2022-06-12 HISTORY — DX: Horseshoe tear of retina without detachment, left eye: H33.312

## 2022-06-12 HISTORY — PX: ROBOTIC ASSISTED LAPAROSCOPIC HYSTERECTOMY AND SALPINGECTOMY: SHX6379

## 2022-06-12 HISTORY — DX: Cervical disc disorder, unspecified, unspecified cervical region: M50.90

## 2022-06-12 HISTORY — DX: Dysplasia of cervix uteri, unspecified: N87.9

## 2022-06-12 LAB — TYPE AND SCREEN
ABO/RH(D): O POS
Antibody Screen: NEGATIVE

## 2022-06-12 LAB — ABO/RH: ABO/RH(D): O POS

## 2022-06-12 SURGERY — XI ROBOTIC ASSISTED LAPAROSCOPIC HYSTERECTOMY AND SALPINGECTOMY
Anesthesia: General | Site: Abdomen | Laterality: Bilateral

## 2022-06-12 MED ORDER — SODIUM CHLORIDE 0.9 % IR SOLN
Status: DC | PRN
  Administered 2022-06-12: 1000 mL

## 2022-06-12 MED ORDER — IBUPROFEN 200 MG PO TABS
ORAL_TABLET | ORAL | Status: AC
  Filled 2022-06-12: qty 3

## 2022-06-12 MED ORDER — ACETAMINOPHEN 500 MG PO TABS
ORAL_TABLET | ORAL | Status: AC
  Filled 2022-06-12: qty 2

## 2022-06-12 MED ORDER — MIDAZOLAM HCL 2 MG/2ML IJ SOLN
INTRAMUSCULAR | Status: AC
  Filled 2022-06-12: qty 2

## 2022-06-12 MED ORDER — ACETAMINOPHEN 500 MG PO TABS: 1000.0000 mg | ORAL_TABLET | Freq: Four times a day (QID) | ORAL | Status: DC

## 2022-06-12 MED ORDER — LACTATED RINGERS IV SOLN: INTRAVENOUS | Status: DC

## 2022-06-12 MED ORDER — OXYCODONE HCL 5 MG PO TABS: 5.0000 mg | ORAL_TABLET | Freq: Four times a day (QID) | ORAL | 0 refills | Status: DC | PRN

## 2022-06-12 MED ORDER — PHENYLEPHRINE HCL (PRESSORS) 10 MG/ML IV SOLN
INTRAVENOUS | Status: DC | PRN
  Administered 2022-06-12: 160 ug via INTRAVENOUS

## 2022-06-12 MED ORDER — HYDROMORPHONE HCL 1 MG/ML IJ SOLN: 0.2000 mg | INTRAMUSCULAR | Status: DC | PRN

## 2022-06-12 MED ORDER — PHENYLEPHRINE 80 MCG/ML (10ML) SYRINGE FOR IV PUSH (FOR BLOOD PRESSURE SUPPORT)
PREFILLED_SYRINGE | INTRAVENOUS | Status: AC
  Filled 2022-06-12: qty 10

## 2022-06-12 MED ORDER — IBUPROFEN 600 MG PO TABS: 600.0000 mg | ORAL_TABLET | Freq: Four times a day (QID) | ORAL | 1 refills | Status: DC | PRN

## 2022-06-12 MED ORDER — DEXAMETHASONE SODIUM PHOSPHATE 10 MG/ML IJ SOLN
INTRAMUSCULAR | Status: DC | PRN
  Administered 2022-06-12: 10 mg via INTRAVENOUS

## 2022-06-12 MED ORDER — SODIUM CHLORIDE 0.9 % IV SOLN
INTRAVENOUS | Status: DC | PRN
  Administered 2022-06-12: 120 mL

## 2022-06-12 MED ORDER — ROCURONIUM BROMIDE 10 MG/ML (PF) SYRINGE
PREFILLED_SYRINGE | INTRAVENOUS | Status: DC | PRN
  Administered 2022-06-12: 20 mg via INTRAVENOUS
  Administered 2022-06-12: 60 mg via INTRAVENOUS

## 2022-06-12 MED ORDER — IBUPROFEN 200 MG PO TABS
600.0000 mg | ORAL_TABLET | Freq: Four times a day (QID) | ORAL | Status: DC
  Administered 2022-06-12: 600 mg via ORAL

## 2022-06-12 MED ORDER — OXYCODONE HCL 5 MG PO TABS: 5.0000 mg | ORAL_TABLET | ORAL | Status: DC | PRN

## 2022-06-12 MED ORDER — GABAPENTIN 300 MG PO CAPS
ORAL_CAPSULE | ORAL | Status: AC
  Filled 2022-06-12: qty 1

## 2022-06-12 MED ORDER — ACETAMINOPHEN 500 MG PO TABS: 1000.0000 mg | ORAL_TABLET | Freq: Three times a day (TID) | ORAL | 0 refills | Status: DC | PRN

## 2022-06-12 MED ORDER — KETOROLAC TROMETHAMINE 30 MG/ML IJ SOLN
INTRAMUSCULAR | Status: AC
  Filled 2022-06-12: qty 1

## 2022-06-12 MED ORDER — DEXMEDETOMIDINE HCL IN NACL 200 MCG/50ML IV SOLN
INTRAVENOUS | Status: DC | PRN
  Administered 2022-06-12: 8 ug via INTRAVENOUS

## 2022-06-12 MED ORDER — HEMOSTATIC AGENTS (NO CHARGE) OPTIME
TOPICAL | Status: DC | PRN
  Administered 2022-06-12: 1 via TOPICAL

## 2022-06-12 MED ORDER — PANTOPRAZOLE SODIUM 40 MG PO TBEC
40.0000 mg | DELAYED_RELEASE_TABLET | Freq: Every day | ORAL | Status: DC
  Administered 2022-06-12: 40 mg via ORAL

## 2022-06-12 MED ORDER — ACETAMINOPHEN 160 MG/5ML PO SOLN: 325.0000 mg | ORAL | Status: DC | PRN

## 2022-06-12 MED ORDER — LIDOCAINE 2% (20 MG/ML) 5 ML SYRINGE
INTRAMUSCULAR | Status: DC | PRN
  Administered 2022-06-12: 100 mg via INTRAVENOUS

## 2022-06-12 MED ORDER — ONDANSETRON HCL 4 MG/2ML IJ SOLN
INTRAMUSCULAR | Status: DC | PRN
  Administered 2022-06-12: 4 mg via INTRAVENOUS

## 2022-06-12 MED ORDER — MEPERIDINE HCL 25 MG/ML IJ SOLN: 6.2500 mg | INTRAMUSCULAR | Status: DC | PRN

## 2022-06-12 MED ORDER — DEXAMETHASONE SODIUM PHOSPHATE 10 MG/ML IJ SOLN
INTRAMUSCULAR | Status: AC
  Filled 2022-06-12: qty 1

## 2022-06-12 MED ORDER — OXYCODONE HCL 5 MG PO TABS: 5.0000 mg | ORAL_TABLET | Freq: Once | ORAL | Status: DC | PRN

## 2022-06-12 MED ORDER — FENTANYL CITRATE (PF) 250 MCG/5ML IJ SOLN
INTRAMUSCULAR | Status: AC
  Filled 2022-06-12: qty 5

## 2022-06-12 MED ORDER — 0.9 % SODIUM CHLORIDE (POUR BTL) OPTIME
TOPICAL | Status: DC | PRN
  Administered 2022-06-12: 500 mL

## 2022-06-12 MED ORDER — METHYLENE BLUE 1 % INJ SOLN
INTRAVENOUS | Status: DC | PRN
  Administered 2022-06-12: 10 mL

## 2022-06-12 MED ORDER — SIMETHICONE 80 MG PO CHEW: 80.0000 mg | CHEWABLE_TABLET | Freq: Four times a day (QID) | ORAL | Status: DC | PRN

## 2022-06-12 MED ORDER — ACETAMINOPHEN 325 MG PO TABS
325.0000 mg | ORAL_TABLET | ORAL | Status: DC | PRN
  Administered 2022-06-12: 500 mg via ORAL

## 2022-06-12 MED ORDER — ONDANSETRON HCL 4 MG/2ML IJ SOLN
INTRAMUSCULAR | Status: AC
  Filled 2022-06-12: qty 2

## 2022-06-12 MED ORDER — ACETAMINOPHEN 500 MG PO TABS
1000.0000 mg | ORAL_TABLET | ORAL | Status: AC
  Administered 2022-06-12: 1000 mg via ORAL

## 2022-06-12 MED ORDER — HYDROMORPHONE HCL 1 MG/ML IJ SOLN
INTRAMUSCULAR | Status: AC
  Filled 2022-06-12: qty 1

## 2022-06-12 MED ORDER — SUGAMMADEX SODIUM 200 MG/2ML IV SOLN
INTRAVENOUS | Status: DC | PRN
  Administered 2022-06-12: 130 mg via INTRAVENOUS

## 2022-06-12 MED ORDER — FENTANYL CITRATE (PF) 100 MCG/2ML IJ SOLN
INTRAMUSCULAR | Status: DC | PRN
  Administered 2022-06-12: 50 ug via INTRAVENOUS
  Administered 2022-06-12: 100 ug via INTRAVENOUS
  Administered 2022-06-12: 50 ug via INTRAVENOUS

## 2022-06-12 MED ORDER — PROPOFOL 10 MG/ML IV BOLUS
INTRAVENOUS | Status: DC | PRN
  Administered 2022-06-12: 150 mg via INTRAVENOUS

## 2022-06-12 MED ORDER — ALUM & MAG HYDROXIDE-SIMETH 200-200-20 MG/5ML PO SUSP: 30.0000 mL | ORAL | Status: DC | PRN

## 2022-06-12 MED ORDER — SENNA 8.6 MG PO TABS
1.0000 | ORAL_TABLET | Freq: Two times a day (BID) | ORAL | Status: DC
  Administered 2022-06-12: 8.6 mg via ORAL

## 2022-06-12 MED ORDER — OXYCODONE HCL 5 MG/5ML PO SOLN: 5.0000 mg | Freq: Once | ORAL | Status: DC | PRN

## 2022-06-12 MED ORDER — MIDAZOLAM HCL 5 MG/5ML IJ SOLN
INTRAMUSCULAR | Status: DC | PRN
  Administered 2022-06-12: 2 mg via INTRAVENOUS

## 2022-06-12 MED ORDER — EPHEDRINE 5 MG/ML INJ
INTRAVENOUS | Status: AC
  Filled 2022-06-12: qty 5

## 2022-06-12 MED ORDER — POVIDONE-IODINE 10 % EX SWAB: 2.0000 | Freq: Once | CUTANEOUS | Status: DC

## 2022-06-12 MED ORDER — ONDANSETRON HCL 4 MG PO TABS: 4.0000 mg | ORAL_TABLET | Freq: Four times a day (QID) | ORAL | Status: DC | PRN

## 2022-06-12 MED ORDER — SODIUM CHLORIDE 0.9 % IV SOLN
INTRAVENOUS | Status: AC
  Filled 2022-06-12: qty 2

## 2022-06-12 MED ORDER — KETOROLAC TROMETHAMINE 30 MG/ML IJ SOLN
INTRAMUSCULAR | Status: DC | PRN
  Administered 2022-06-12: 30 mg via INTRAVENOUS

## 2022-06-12 MED ORDER — EPHEDRINE SULFATE (PRESSORS) 50 MG/ML IJ SOLN
INTRAMUSCULAR | Status: DC | PRN
  Administered 2022-06-12: 10 mg via INTRAVENOUS
  Administered 2022-06-12: 5 mg via INTRAVENOUS
  Administered 2022-06-12: 10 mg via INTRAVENOUS

## 2022-06-12 MED ORDER — DEXMEDETOMIDINE HCL IN NACL 80 MCG/20ML IV SOLN
INTRAVENOUS | Status: AC
  Filled 2022-06-12: qty 20

## 2022-06-12 MED ORDER — ROCURONIUM BROMIDE 10 MG/ML (PF) SYRINGE
PREFILLED_SYRINGE | INTRAVENOUS | Status: AC
  Filled 2022-06-12: qty 10

## 2022-06-12 MED ORDER — PROPOFOL 10 MG/ML IV BOLUS
INTRAVENOUS | Status: AC
  Filled 2022-06-12: qty 20

## 2022-06-12 MED ORDER — ZOLPIDEM TARTRATE 5 MG PO TABS: 5.0000 mg | ORAL_TABLET | Freq: Every evening | ORAL | Status: DC | PRN

## 2022-06-12 MED ORDER — FENTANYL CITRATE (PF) 100 MCG/2ML IJ SOLN: 25.0000 ug | INTRAMUSCULAR | Status: DC | PRN

## 2022-06-12 MED ORDER — LIDOCAINE HCL (PF) 2 % IJ SOLN
INTRAMUSCULAR | Status: AC
  Filled 2022-06-12: qty 5

## 2022-06-12 MED ORDER — PANTOPRAZOLE SODIUM 40 MG PO TBEC
DELAYED_RELEASE_TABLET | ORAL | Status: AC
  Filled 2022-06-12: qty 1

## 2022-06-12 MED ORDER — SENNA 8.6 MG PO TABS
ORAL_TABLET | ORAL | Status: AC
  Filled 2022-06-12: qty 1

## 2022-06-12 MED ORDER — ONDANSETRON HCL 4 MG/2ML IJ SOLN: 4.0000 mg | Freq: Once | INTRAMUSCULAR | Status: DC | PRN

## 2022-06-12 MED ORDER — SODIUM CHLORIDE 0.9 % IV SOLN
2.0000 g | INTRAVENOUS | Status: AC
  Administered 2022-06-12: 2 g via INTRAVENOUS

## 2022-06-12 MED ORDER — ONDANSETRON HCL 4 MG/2ML IJ SOLN
4.0000 mg | Freq: Four times a day (QID) | INTRAMUSCULAR | Status: DC | PRN
  Administered 2022-06-12: 4 mg via INTRAVENOUS

## 2022-06-12 MED ORDER — MENTHOL 3 MG MT LOZG: 1.0000 | LOZENGE | OROMUCOSAL | Status: DC | PRN

## 2022-06-12 MED ORDER — GABAPENTIN 300 MG PO CAPS
300.0000 mg | ORAL_CAPSULE | ORAL | Status: AC
  Administered 2022-06-12: 300 mg via ORAL

## 2022-06-12 SURGICAL SUPPLY — 62 items
ADH SKN CLS APL DERMABOND .7 (GAUZE/BANDAGES/DRESSINGS) ×1
APL SRG 38 LTWT LNG FL B (MISCELLANEOUS) ×1
APPLICATOR ARISTA FLEXITIP XL (MISCELLANEOUS) IMPLANT
CATH FOLEY 3WAY  5CC 16FR (CATHETERS) ×1
CATH FOLEY 3WAY 5CC 16FR (CATHETERS) ×1 IMPLANT
COVER BACK TABLE 60X90IN (DRAPES) ×1 IMPLANT
COVER TIP SHEARS 8 DVNC (MISCELLANEOUS) ×1 IMPLANT
COVER TIP SHEARS 8MM DA VINCI (MISCELLANEOUS) ×1
DEFOGGER SCOPE WARMER CLEARIFY (MISCELLANEOUS) ×1 IMPLANT
DERMABOND ADVANCED .7 DNX12 (GAUZE/BANDAGES/DRESSINGS) ×1 IMPLANT
DILATOR CANAL MILEX (MISCELLANEOUS) ×1 IMPLANT
DRAPE ARM DVNC X/XI (DISPOSABLE) ×4 IMPLANT
DRAPE COLUMN DVNC XI (DISPOSABLE) ×1 IMPLANT
DRAPE DA VINCI XI ARM (DISPOSABLE) ×4
DRAPE DA VINCI XI COLUMN (DISPOSABLE) ×1
DRAPE UTILITY 15X26 TOWEL STRL (DRAPES) ×1 IMPLANT
DURAPREP 26ML APPLICATOR (WOUND CARE) ×1 IMPLANT
ELECT REM PT RETURN 9FT ADLT (ELECTROSURGICAL) ×1
ELECTRODE REM PT RTRN 9FT ADLT (ELECTROSURGICAL) ×1 IMPLANT
GLOVE BIOGEL M 6.5 STRL (GLOVE) ×3 IMPLANT
GLOVE BIOGEL M 7.0 STRL (GLOVE) ×3 IMPLANT
GLOVE BIOGEL PI IND STRL 6.5 (GLOVE) ×6 IMPLANT
GLOVE BIOGEL PI IND STRL 7.0 (GLOVE) ×6 IMPLANT
HEMOSTAT ARISTA ABSORB 3G PWDR (HEMOSTASIS) IMPLANT
HOLDER FOLEY CATH W/STRAP (MISCELLANEOUS) IMPLANT
IRRIG SUCT STRYKERFLOW 2 WTIP (MISCELLANEOUS) ×1
IRRIGATION SUCT STRKRFLW 2 WTP (MISCELLANEOUS) ×1 IMPLANT
IV NS 1000ML (IV SOLUTION) ×1
IV NS 1000ML BAXH (IV SOLUTION) IMPLANT
KIT TURNOVER CYSTO (KITS) ×1 IMPLANT
LEGGING LITHOTOMY PAIR STRL (DRAPES) ×1 IMPLANT
MANIFOLD NEPTUNE II (INSTRUMENTS) ×1 IMPLANT
NDL HYPO 18GX1.5 BLUNT FILL (NEEDLE) IMPLANT
NEEDLE HYPO 18GX1.5 BLUNT FILL (NEEDLE) ×1 IMPLANT
OBTURATOR OPTICAL STANDARD 8MM (TROCAR) ×1
OBTURATOR OPTICAL STND 8 DVNC (TROCAR) ×1
OBTURATOR OPTICALSTD 8 DVNC (TROCAR) ×1 IMPLANT
OCCLUDER COLPOPNEUMO (BALLOONS) ×1 IMPLANT
PACK ROBOT WH (CUSTOM PROCEDURE TRAY) ×1 IMPLANT
PACK ROBOTIC GOWN (GOWN DISPOSABLE) ×1 IMPLANT
PACK TRENDGUARD 450 HYBRID PRO (MISCELLANEOUS) IMPLANT
PAD OB MATERNITY 4.3X12.25 (PERSONAL CARE ITEMS) ×1 IMPLANT
PAD PREP 24X48 CUFFED NSTRL (MISCELLANEOUS) ×1 IMPLANT
PROTECTOR NERVE ULNAR (MISCELLANEOUS) ×1 IMPLANT
SEAL CANN UNIV 5-8 DVNC XI (MISCELLANEOUS) ×4 IMPLANT
SEAL XI 5MM-8MM UNIVERSAL (MISCELLANEOUS) ×4
SEALER VESSEL DA VINCI XI (MISCELLANEOUS) ×1
SEALER VESSEL EXT DVNC XI (MISCELLANEOUS) IMPLANT
SET IRRIG Y TYPE TUR BLADDER L (SET/KITS/TRAYS/PACK) IMPLANT
SET TRI-LUMEN FLTR TB AIRSEAL (TUBING) ×1 IMPLANT
SPIKE FLUID TRANSFER (MISCELLANEOUS) ×2 IMPLANT
SUT VIC AB 0 CT1 27 (SUTURE) ×2
SUT VIC AB 0 CT1 27XBRD ANBCTR (SUTURE) ×2 IMPLANT
SUT VICRYL 0 UR6 27IN ABS (SUTURE) IMPLANT
SUT VICRYL RAPIDE 4/0 PS 2 (SUTURE) ×3 IMPLANT
SUT VLOC 180 0 9IN  GS21 (SUTURE) ×1
SUT VLOC 180 0 9IN GS21 (SUTURE) ×1 IMPLANT
SYR 10ML LL (SYRINGE) IMPLANT
TIP UTERINE 5.1X6CM LAV DISP (MISCELLANEOUS) IMPLANT
TOWEL OR 17X26 10 PK STRL BLUE (TOWEL DISPOSABLE) ×1 IMPLANT
TRENDGUARD 450 HYBRID PRO PACK (MISCELLANEOUS) ×1
TROCAR PORT AIRSEAL 8X120 (TROCAR) ×1 IMPLANT

## 2022-06-12 NOTE — H&P (Signed)
Date of Initial H&P: 06/11/2022  History reviewed, patient examined, no change in status, stable for surgery.  

## 2022-06-12 NOTE — Progress Notes (Signed)
Warm blankets applied

## 2022-06-12 NOTE — Anesthesia Procedure Notes (Signed)
Procedure Name: Intubation Date/Time: 06/12/2022 7:37 AM  Performed by: Rogers Blocker, CRNAPre-anesthesia Checklist: Patient identified, Emergency Drugs available, Suction available and Patient being monitored Patient Re-evaluated:Patient Re-evaluated prior to induction Oxygen Delivery Method: Circle System Utilized Preoxygenation: Pre-oxygenation with 100% oxygen Induction Type: IV induction Ventilation: Mask ventilation without difficulty Laryngoscope Size: Mac and 3 Grade View: Grade I Tube type: Oral Tube size: 7.0 mm Number of attempts: 1 Airway Equipment and Method: Stylet and Bite block Placement Confirmation: ETT inserted through vocal cords under direct vision, positive ETCO2 and breath sounds checked- equal and bilateral Secured at: 22 cm Tube secured with: Tape Dental Injury: Teeth and Oropharynx as per pre-operative assessment

## 2022-06-12 NOTE — Op Note (Signed)
06/12/2022  9:54 AM  PATIENT:  Karen Navarro  56 y.o. female  PRE-OPERATIVE DIAGNOSIS:  Cervical Intraepithelial Neoplasia II  POST-OPERATIVE DIAGNOSIS:  Cervical Intraepithelial Neoplasia II  PROCEDURE:  Procedure(s): XI ROBOTIC ASSISTED LAPAROSCOPIC HYSTERECTOMY AND BILATERAL SALPINGO-OOPHORECTOMY (Bilateral)  SURGEON:  Surgeon(s) and Role:    Gerald Leitz, MD - Primary    * Steva Ready, DO - Assisting  PHYSICIAN ASSISTANT: None  ASSISTANTS: Dr. Steva Ready  assisted due to complexity of the anatomy    ANESTHESIA:   general  EBL:  25 mL   BLOOD ADMINISTERED:none  DRAINS: none   LOCAL MEDICATIONS USED:  OTHER ropivicaine   SPECIMEN:  Source of Specimen:  uterus cervix and bilateral fallopian tubes and ovaries.   DISPOSITION OF SPECIMEN:  PATHOLOGY  COUNTS:  YES  TOURNIQUET:  * No tourniquets in log *  DICTATION: .Note written in EPIC  PLAN OF CARE: Admit for overnight observation  PATIENT DISPOSITION:  PACU - hemodynamically stable.   Delay start of Pharmacological VTE agent (>24hrs) due to surgical blood loss or risk of bleeding: not applicable  Finding: the cervix was flush with the vaginal mucosa... normal appearing fallopian tubes and ovaries.  Adhesions of the colon to the posterior aspect of the uterus and the left pelvic side wall.   Procedure: The patient was taken to the operating room #5 at [facility] where she was placed under general anesthesia.Time out was performed. Marland Kitchen She was placed in dorsal lithotomy position and prepped and draped in the usual sterile fashion. A weighted speculum was placed into the vagina. A Deaver was placed anteriorly for retraction. The anterior lip of the cervix was grasped with a single-tooth tenaculum. The vaginal mucosa was injected with 2.5 cc of ropivacaine at the 2/4/ 8 and 10 o'clock positions. The uterus was sounded to 6 cm. the cervix was dilated to 6 mm . 0 vicryl suture placed at the 12 o'clock position Of the  cervix to facilitate placement of a Ru mi uterine manipulator. The manipulator was placed without difficulty. Weighted speculum and Deaver were removed.   Attention was turned to the patient's abdomen where a 8 mm trocar was placed at the umbilicus under direct visualization . The pneumoperitoneum was achieved with PCO2 gas. The laparoscope was removed. 60 cc of ropivacaine were injected into the abdominal cavity. The laparoscope was reinserted. An 8 mm trocar was placed in the right upper quadrant 16 centimeters from the umbilicus.later connected to robotic arm #4). An incision was made in the Right upper quadrant TROCAR WAS PLACED 8 cm from the umbilicus. Later connected to robotic arm #3. An 8 mm incision was made in the left upper quadrant 16 cm from the umbilicus and connected to robot arm #1. Marland Kitchen Attention was turned to the left upper quadrant where a 8 mm midclavicular assistant trocar was placed. ( All incision sites were injected with 10cc of ropivacaine prior to port placement.)   Once all ports had been placed under direct visualization.The laparoscope was removed and the [employer] robotic system was then right-sided docked. The robotic arms were connected to the corresponding trocars as listed above. The laparoscope was then reinserted. The long tip bipolar forceps were placed into port #1. The prograsp placed in the port #4. A vessel sealer ( alternating with monopolar scissors ) was placed in port #3. All instruments were directed into the pelvis under direct visualization.   Attention was turned to the surgeons console. The left  infundibulo-pelvic  ligament was cauterized and transected with the vessel sealer The broad ligament was cauterized and transected with the vessel sealer .The round ligament was cauterized and transected with the vessel sealer  The anterior leaf of broad ligament was incised along the bladder reflection to the midline.  The right  infundibulo-pelvic ligament was  cauterized and transected with the vessel sealer. The right broad ligament was cauterized and transected with the vessel sealer. The right round ligament was cauterized and transected with the vessel sealer The broad ligament was incised to the midline. The bladder was dissected off the lower uterine segments of the cervix via sharp and blunt dissection.   The uterine arteries were skeleton bilaterally. They were  cauterized and transected with the vessel sealer The KOH ring was identified. The anterior colpotomy was performed followed by the posterior colpotomy. Once the uterus,cervix and bilateral fallopian tubes were completely excised was removed through the vagina. The  bipolar forceps and scissors were removed and cobra  forceps were placed in the port #1 and the mega needle driver was placed in to port #3.  The vaginal cuff was closed with running suture if 0 v-lock. The pelvis was irrigated. Marland KitchenMarland KitchenMarland KitchenExcellent hemostasis was noted. Arista was placed along the vaginal cuff.  All pelvic pedicles were examined and hemostasis was noted.  All instruments removed from the ports. All ports were removed under direct Visualization. The pneumoperitoneum was released. The skin incisions were closed with 4-0 Vicryl and then covered with Derma bond.     Sponge lap and needle counts weIre correct x. The patient was awakened from anesthesia and taken to the recovery room in stable condition.

## 2022-06-12 NOTE — Discharge Summary (Signed)
Physician Discharge Summary  Patient ID: Karen Navarro MRN: [id number] DOB/AGE: Feb 23, 1966 56 y.o.  Admit date: 06/12/2022 Discharge date: 06/12/2022  Admission Diagnoses: CIN II Moderate cervical dysplasia Discharge Diagnoses:  Principal Problem:   S/P hysterectomy with oophorectomy Active Problems:   Cervical dysplasia   Discharged Condition: stable  Hospital Course: pt was admitted for observation after undergoing robotic assisted laparoscopic hysterectomy with bilateral salpingo-oophorectomy. She did well postoperatively and desired discharge home on pod #0. Her pain was well controlled she had return of bladder function and was ambulating prior to discharge.   Consults: None  Significant Diagnostic Studies: None  Treatments: surgery: robotic assisted laparoscopic hysterectomy with bilateral salpingo-oophorectomy   Discharge Exam: Blood pressure 134/76, pulse 96, temperature 97.9 F (36.6 C), resp. rate 15, height 5\' 5"  (1.651 m), weight 69.6 kg, last menstrual period 09/21/2016, SpO2 100 %. General appearance: alert, cooperative, and no distress Resp: no distress normal effort  GI: soft appropriately tender nondistended  Extremities: extremities normal, atraumatic, no cyanosis or edema Incision/Wound: well approximated no erythema or exudate   Disposition: Discharge disposition: 01-Home or Self Care       Discharge Instructions     Call MD for:  persistant nausea and vomiting   Complete by: As directed    Call MD for:  redness, tenderness, or signs of infection (pain, swelling, redness, odor or green/yellow discharge around incision site)   Complete by: As directed    Call MD for:  severe uncontrolled pain   Complete by: As directed    Call MD for:  temperature >100.4   Complete by: As directed    Diet general   Complete by: As directed    Driving Restrictions   Complete by: As directed    Avoid driving for 1 week   Increase activity slowly   Complete by:  As directed    Lifting restrictions   Complete by: As directed    Avoid lifting over 10 lbs   May shower / Bathe   Complete by: As directed    May walk up steps   Complete by: As directed    No wound care   Complete by: As directed    Sexual Activity Restrictions   Complete by: As directed    Avoid sex for 6 weeks and until approved by Dr. June      Allergies as of 06/12/2022       Reactions   Sulfa Antibiotics Swelling        Medication List     TAKE these medications    acetaminophen 500 MG tablet Commonly known as: TYLENOL Take 2 tablets (1,000 mg total) by mouth every 8 (eight) hours as needed.   Calcium 600+D 600-20 MG-MCG Tabs Generic drug: Calcium Carb-Cholecalciferol Take 1 tablet by mouth daily.   erythromycin with ethanol 2 % external solution Commonly known as: THERAMYCIN Apply 1 Application topically daily as needed (acne).   Fish Oil 1200 MG Caps Take 1,200 mg by mouth daily.   ibuprofen 600 MG tablet Commonly known as: ADVIL Take 1 tablet (600 mg total) by mouth every 6 (six) hours as needed for moderate pain, mild pain or cramping.   levocetirizine 5 MG tablet Commonly known as: XYZAL TAKE 1 TABLET BY MOUTH AS NEEDED   meclizine 25 MG tablet Commonly known as: ANTIVERT Take 1 tablet (25 mg total) by mouth 3 (three) times daily as needed for dizziness.   Multiple Vitamin tablet Take 1 tablet by mouth daily. 50 +  oxyCODONE 5 MG immediate release tablet Commonly known as: Oxy IR/ROXICODONE Take 1-2 tablets (5-10 mg total) by mouth every 6 (six) hours as needed for moderate pain.   valACYclovir 500 MG tablet Commonly known as: VALTREX Take 500 mg by mouth daily as needed (Breakout).   Vitamin B-12 3000 MCG Subl Place under the tongue. Regular pill        Follow-up Information     Gerald Leitz, MD. Go in 2 week(s).   Specialty: Obstetrics and Gynecology Contact information: [address] [place]  [address] [phone]                 Signed: Gerald Leitz 06/12/2022, 4:16 PM

## 2022-06-12 NOTE — Progress Notes (Signed)
Patient became nauseas after ambulation, Zofran given.

## 2022-06-12 NOTE — Anesthesia Postprocedure Evaluation (Signed)
Anesthesia Post Note  Patient: Karen Navarro  Procedure(s) Performed: XI ROBOTIC ASSISTED LAPAROSCOPIC HYSTERECTOMY AND BILATERAL SALPINGO-OOPHORECTOMY (Bilateral: Abdomen)     Anesthesia Type: General Anesthetic complications: no   No notable events documented.  Last Vitals:  Vitals:   06/12/22 1022 06/12/22 1033  BP: 136/80 (!) 160/88  Pulse:  95  Resp:  14  Temp:    SpO2:  100%    Last Pain:  Vitals:   06/12/22 1033  TempSrc:   PainSc: 2                  Reagyn Facemire

## 2022-06-12 NOTE — Discharge Instructions (Signed)

## 2022-06-12 NOTE — Transfer of Care (Signed)
Immediate Anesthesia Transfer of Care Note  Patient: Karen Navarro  Procedure(s) Performed: XI ROBOTIC ASSISTED LAPAROSCOPIC HYSTERECTOMY AND BILATERAL SALPINGO-OOPHORECTOMY (Bilateral: Abdomen)  Patient Location: PACU  Anesthesia Type:General  Level of Consciousness: drowsy  Airway & Oxygen Therapy: Patient Spontanous Breathing and Patient connected to nasal cannula oxygen  Post-op Assessment: Report given to RN  Post vital signs: Reviewed and stable  Last Vitals:  Vitals Value Taken Time  BP 135/73 06/12/22 0946  Temp    Pulse 71 06/12/22 0948  Resp 11 06/12/22 0948  SpO2 98 % 06/12/22 0948  Vitals shown include unvalidated device data.  Last Pain:  Vitals:   06/12/22 0607  TempSrc: Oral  PainSc: 0-No pain      Patients Stated Pain Goal: 6 (06/12/22 0165)  Complications: No notable events documented.

## 2022-06-13 ENCOUNTER — Encounter (HOSPITAL_BASED_OUTPATIENT_CLINIC_OR_DEPARTMENT_OTHER): Payer: Self-pay | Admitting: Obstetrics and Gynecology

## 2022-06-13 LAB — SURGICAL PATHOLOGY

## 2022-07-01 IMAGING — MG MM DIGITAL SCREENING BILAT W/ TOMO AND CAD
6 of 10 series · 6 of 30 positions shown · non-contrast
Comparison: Previous exam(s).

CLINICAL DATA: Screening.

EXAM:
DIGITAL SCREENING BILATERAL MAMMOGRAM WITH TOMOSYNTHESIS AND CAD
TECHNIQUE: Bilateral screening digital craniocaudal and mediolateral oblique
mammograms were obtained. Bilateral screening digital breast
tomosynthesis was performed. The images were evaluated with
computer-aided detection.

[L MLO synth-2D (1 of 2)]
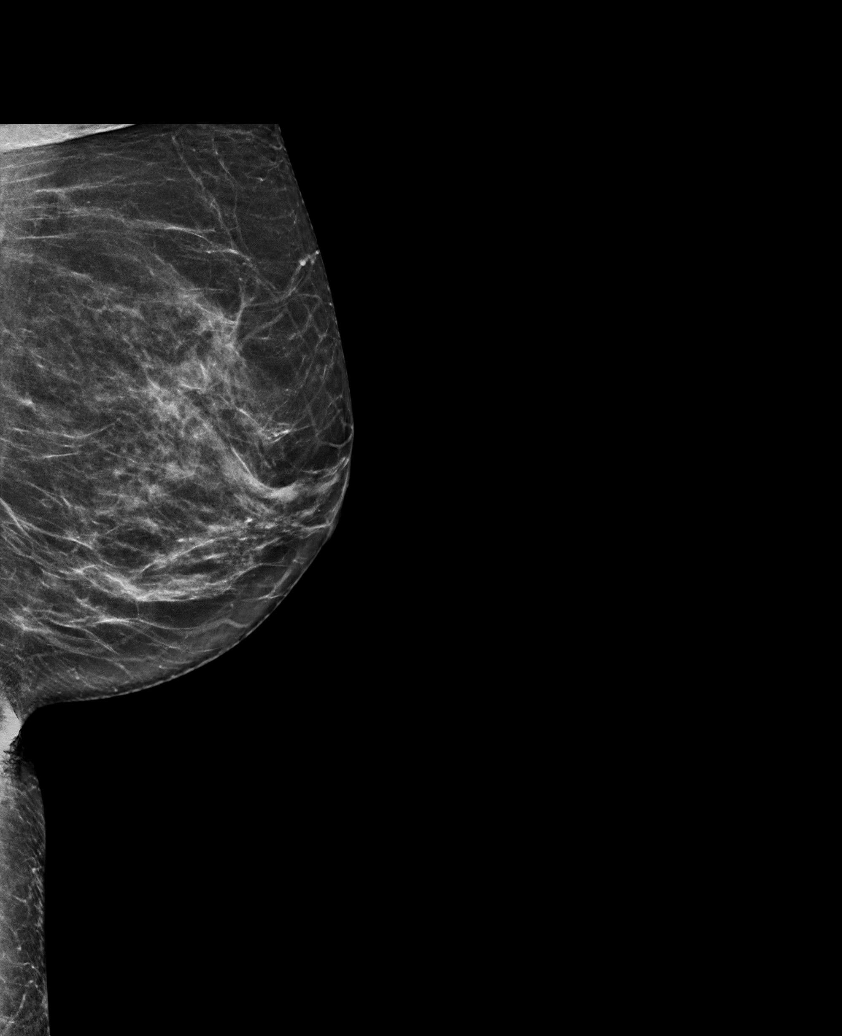

[L CC synth-2D]
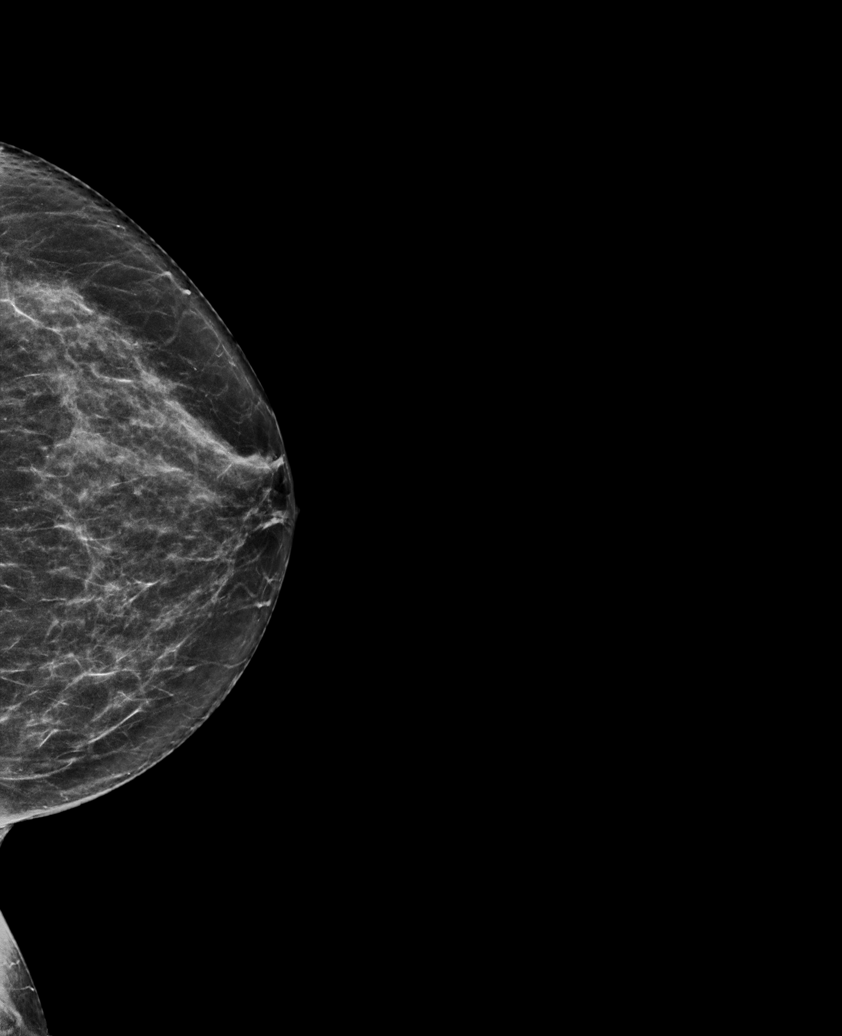

[R CC synth-2D]
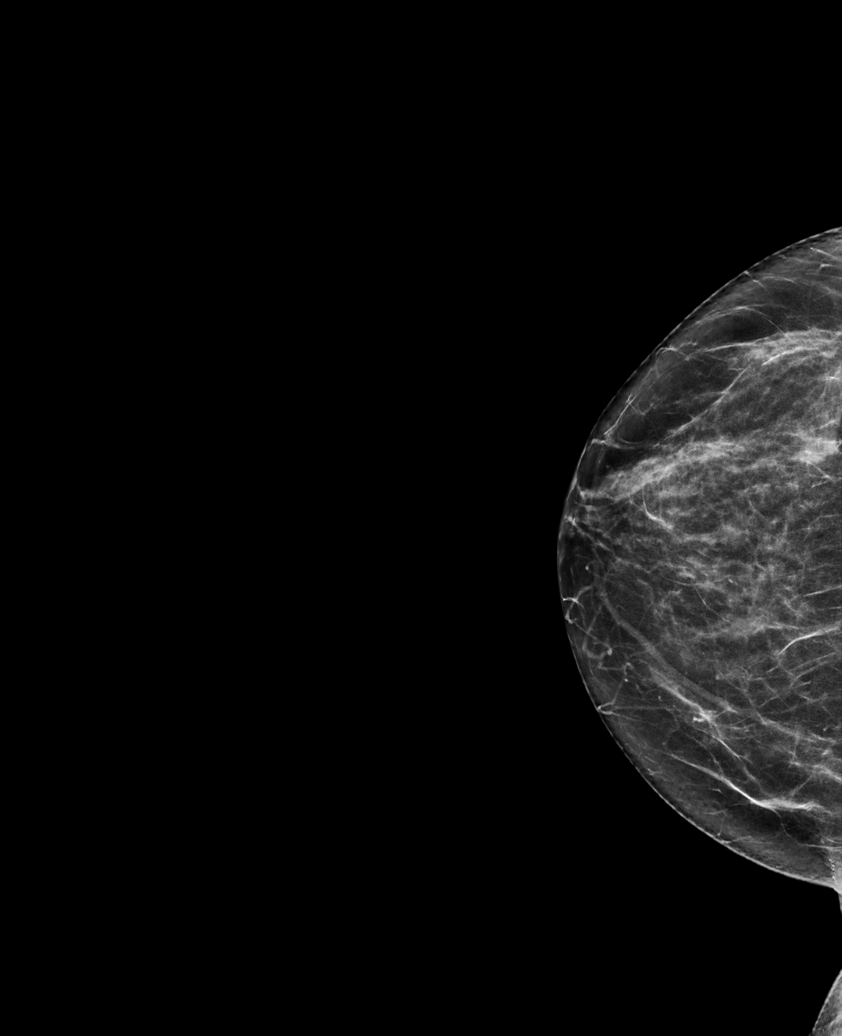

[L MLO synth-2D (2 of 2)]
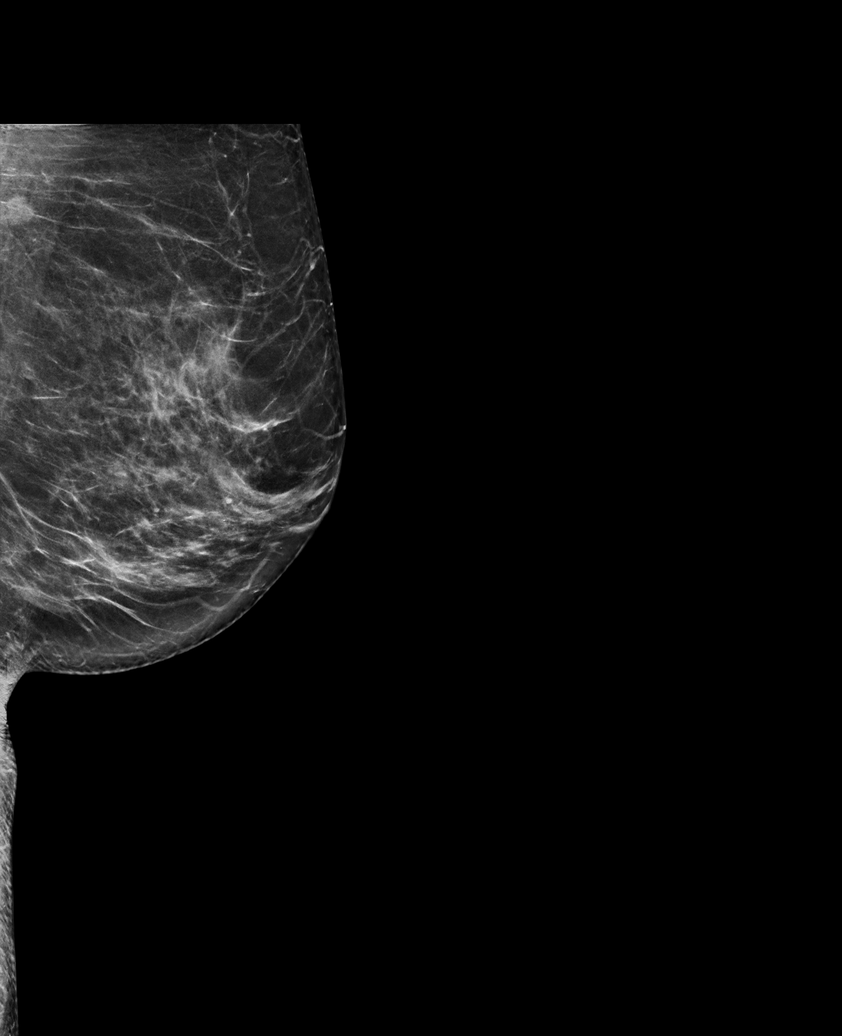

[R MLO synth-2D]
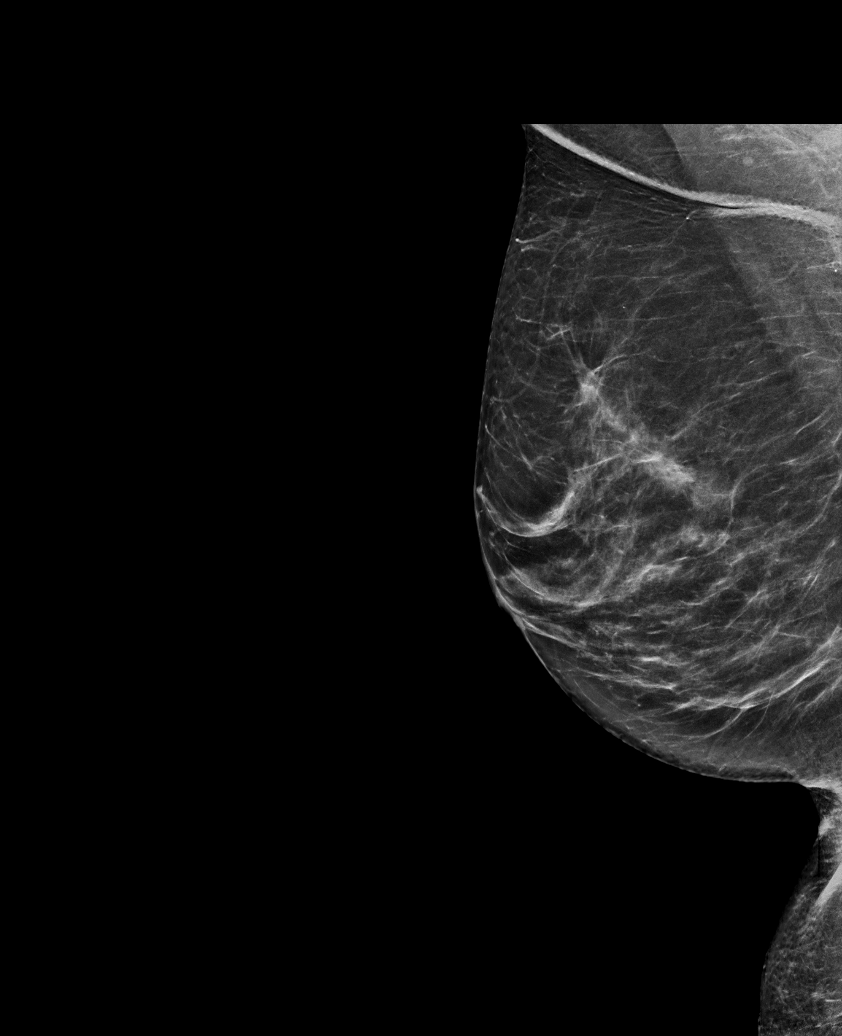

[R MLO tomo · tomo slice 36/71.0]
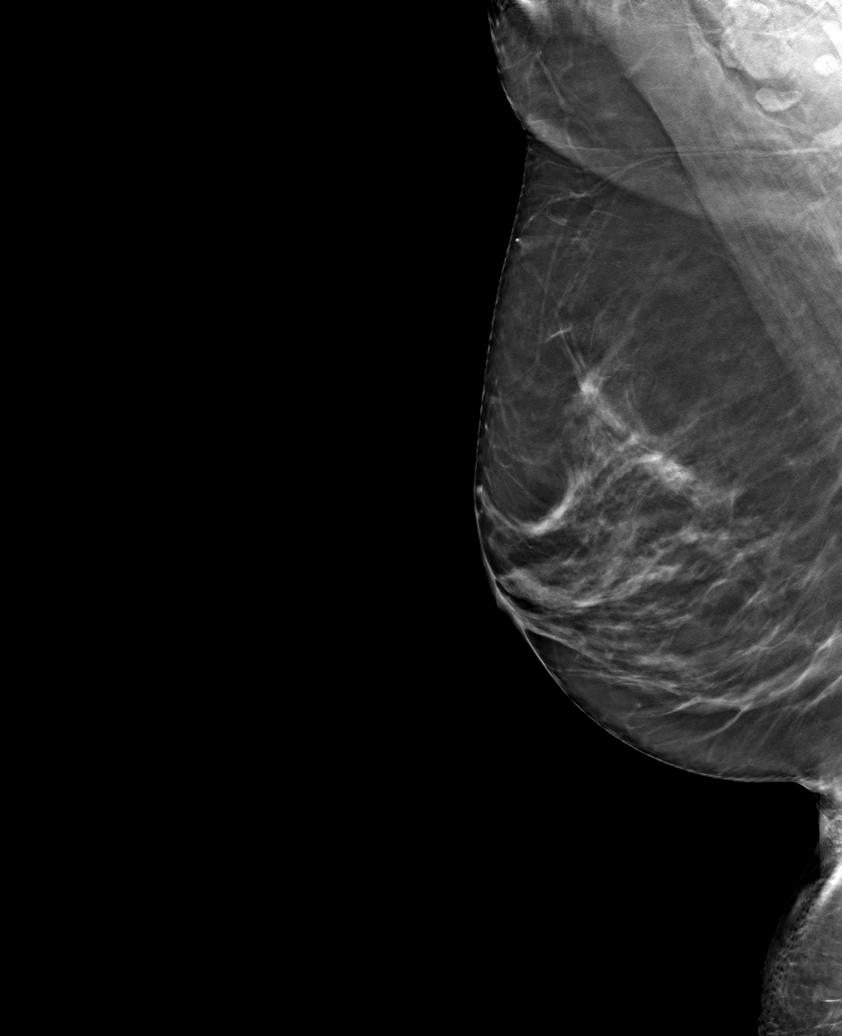

[6 of 30 positions shown; findings below may reference images not displayed]

ACR Breast Density Category b: There are scattered areas of
fibroglandular density.
FINDINGS: In the right breast a mass requires further evaluation.

In the left breast a mass requires further evaluation.
IMPRESSION: Further evaluation is suggested for possible mass in the right
breast.

Further evaluation is suggested for possible mass in the left
breast.

RECOMMENDATION:
Diagnostic mammogram and possibly ultrasound of both breasts.
(Code:[id number])

The patient will be contacted regarding the findings, and additional
imaging will be scheduled.

BI-RADS CATEGORY  0: Incomplete. Need additional imaging evaluation
and/or prior mammograms for comparison.

## 2022-08-02 DIAGNOSIS — N939 Abnormal uterine and vaginal bleeding, unspecified: Secondary | ICD-10-CM | POA: Diagnosis not present

## 2022-08-24 ENCOUNTER — Other Ambulatory Visit: Payer: Self-pay | Admitting: Obstetrics and Gynecology

## 2022-08-24 DIAGNOSIS — Z1231 Encounter for screening mammogram for malignant neoplasm of breast: Secondary | ICD-10-CM

## 2022-09-04 ENCOUNTER — Encounter (INDEPENDENT_AMBULATORY_CARE_PROVIDER_SITE_OTHER): Payer: BC Managed Care – PPO | Admitting: Ophthalmology

## 2022-09-20 ENCOUNTER — Other Ambulatory Visit: Payer: Self-pay | Admitting: Allergy

## 2022-09-28 DIAGNOSIS — F411 Generalized anxiety disorder: Secondary | ICD-10-CM | POA: Diagnosis not present

## 2022-10-03 ENCOUNTER — Ambulatory Visit
Admission: RE | Admit: 2022-10-03 | Discharge: 2022-10-03 | Disposition: A | Discharge status: Home / Self Care | Payer: BC Managed Care – PPO | Source: Ambulatory Visit | Attending: Obstetrics and Gynecology | Admitting: Obstetrics and Gynecology

## 2022-10-03 DIAGNOSIS — Z1231 Encounter for screening mammogram for malignant neoplasm of breast: Secondary | ICD-10-CM

## 2022-11-29 DIAGNOSIS — F411 Generalized anxiety disorder: Secondary | ICD-10-CM | POA: Diagnosis not present

## 2023-01-07 DIAGNOSIS — M19041 Primary osteoarthritis, right hand: Secondary | ICD-10-CM | POA: Diagnosis not present

## 2023-01-07 DIAGNOSIS — M180 Bilateral primary osteoarthritis of first carpometacarpal joints: Secondary | ICD-10-CM | POA: Diagnosis not present

## 2023-03-18 DIAGNOSIS — H43811 Vitreous degeneration, right eye: Secondary | ICD-10-CM | POA: Diagnosis not present

## 2023-04-16 DIAGNOSIS — L814 Other melanin hyperpigmentation: Secondary | ICD-10-CM | POA: Diagnosis not present

## 2023-04-16 DIAGNOSIS — L578 Other skin changes due to chronic exposure to nonionizing radiation: Secondary | ICD-10-CM | POA: Diagnosis not present

## 2023-04-16 DIAGNOSIS — L821 Other seborrheic keratosis: Secondary | ICD-10-CM | POA: Diagnosis not present

## 2023-04-16 DIAGNOSIS — D229 Melanocytic nevi, unspecified: Secondary | ICD-10-CM | POA: Diagnosis not present

## 2023-04-16 DIAGNOSIS — L7211 Pilar cyst: Secondary | ICD-10-CM | POA: Diagnosis not present

## 2023-05-01 DIAGNOSIS — H43811 Vitreous degeneration, right eye: Secondary | ICD-10-CM | POA: Diagnosis not present

## 2023-05-14 DIAGNOSIS — M180 Bilateral primary osteoarthritis of first carpometacarpal joints: Secondary | ICD-10-CM | POA: Diagnosis not present

## 2023-08-15 DIAGNOSIS — M180 Bilateral primary osteoarthritis of first carpometacarpal joints: Secondary | ICD-10-CM | POA: Diagnosis not present

## 2023-09-06 ENCOUNTER — Other Ambulatory Visit: Payer: Self-pay | Admitting: Obstetrics and Gynecology

## 2023-09-06 DIAGNOSIS — Z1231 Encounter for screening mammogram for malignant neoplasm of breast: Secondary | ICD-10-CM

## 2023-10-04 ENCOUNTER — Other Ambulatory Visit (HOSPITAL_COMMUNITY)
Admission: RE | Admit: 2023-10-04 | Discharge: 2023-10-04 | Disposition: A | Discharge status: Home / Self Care | Source: Ambulatory Visit | Attending: Obstetrics and Gynecology | Admitting: Obstetrics and Gynecology

## 2023-10-04 ENCOUNTER — Other Ambulatory Visit: Payer: Self-pay | Admitting: Obstetrics and Gynecology

## 2023-10-04 DIAGNOSIS — D069 Carcinoma in situ of cervix, unspecified: Secondary | ICD-10-CM | POA: Insufficient documentation

## 2023-10-04 DIAGNOSIS — Z01419 Encounter for gynecological examination (general) (routine) without abnormal findings: Secondary | ICD-10-CM | POA: Insufficient documentation

## 2023-10-10 ENCOUNTER — Ambulatory Visit
Admission: RE | Admit: 2023-10-10 | Discharge: 2023-10-10 | Disposition: A | Discharge status: Home / Self Care | Payer: BC Managed Care – PPO | Source: Ambulatory Visit | Attending: Obstetrics and Gynecology | Admitting: Obstetrics and Gynecology

## 2023-10-10 DIAGNOSIS — Z1231 Encounter for screening mammogram for malignant neoplasm of breast: Secondary | ICD-10-CM | POA: Diagnosis not present

## 2023-10-10 LAB — CYTOLOGY - PAP
Comment: NEGATIVE
Diagnosis: NEGATIVE
High risk HPV: NEGATIVE

## 2023-10-21 DIAGNOSIS — M19041 Primary osteoarthritis, right hand: Secondary | ICD-10-CM | POA: Diagnosis not present

## 2023-10-21 DIAGNOSIS — M1811 Unilateral primary osteoarthritis of first carpometacarpal joint, right hand: Secondary | ICD-10-CM | POA: Diagnosis not present

## 2023-10-31 DIAGNOSIS — M79641 Pain in right hand: Secondary | ICD-10-CM | POA: Diagnosis not present

## 2023-10-31 DIAGNOSIS — M180 Bilateral primary osteoarthritis of first carpometacarpal joints: Secondary | ICD-10-CM | POA: Diagnosis not present

## 2023-10-31 DIAGNOSIS — M25641 Stiffness of right hand, not elsewhere classified: Secondary | ICD-10-CM | POA: Diagnosis not present

## 2023-10-31 DIAGNOSIS — R531 Weakness: Secondary | ICD-10-CM | POA: Diagnosis not present

## 2023-11-20 DIAGNOSIS — M79641 Pain in right hand: Secondary | ICD-10-CM | POA: Diagnosis not present

## 2023-11-20 DIAGNOSIS — M25641 Stiffness of right hand, not elsewhere classified: Secondary | ICD-10-CM | POA: Diagnosis not present

## 2023-11-20 DIAGNOSIS — R531 Weakness: Secondary | ICD-10-CM | POA: Diagnosis not present

## 2023-11-27 DIAGNOSIS — M79641 Pain in right hand: Secondary | ICD-10-CM | POA: Diagnosis not present

## 2023-11-27 DIAGNOSIS — R531 Weakness: Secondary | ICD-10-CM | POA: Diagnosis not present

## 2023-11-27 DIAGNOSIS — M25641 Stiffness of right hand, not elsewhere classified: Secondary | ICD-10-CM | POA: Diagnosis not present

## 2023-12-04 DIAGNOSIS — M79641 Pain in right hand: Secondary | ICD-10-CM | POA: Diagnosis not present

## 2023-12-04 DIAGNOSIS — R531 Weakness: Secondary | ICD-10-CM | POA: Diagnosis not present

## 2023-12-04 DIAGNOSIS — M25641 Stiffness of right hand, not elsewhere classified: Secondary | ICD-10-CM | POA: Diagnosis not present

## 2023-12-06 DIAGNOSIS — N951 Menopausal and female climacteric states: Secondary | ICD-10-CM | POA: Diagnosis not present

## 2023-12-06 DIAGNOSIS — Z0000 Encounter for general adult medical examination without abnormal findings: Secondary | ICD-10-CM | POA: Diagnosis not present

## 2023-12-06 DIAGNOSIS — R5383 Other fatigue: Secondary | ICD-10-CM | POA: Diagnosis not present

## 2023-12-06 DIAGNOSIS — Z6825 Body mass index (BMI) 25.0-25.9, adult: Secondary | ICD-10-CM | POA: Diagnosis not present

## 2023-12-06 DIAGNOSIS — R635 Abnormal weight gain: Secondary | ICD-10-CM | POA: Diagnosis not present

## 2023-12-06 DIAGNOSIS — Z13220 Encounter for screening for lipoid disorders: Secondary | ICD-10-CM | POA: Diagnosis not present

## 2023-12-12 DIAGNOSIS — M79641 Pain in right hand: Secondary | ICD-10-CM | POA: Diagnosis not present

## 2023-12-12 DIAGNOSIS — M25641 Stiffness of right hand, not elsewhere classified: Secondary | ICD-10-CM | POA: Diagnosis not present

## 2023-12-12 DIAGNOSIS — R531 Weakness: Secondary | ICD-10-CM | POA: Diagnosis not present

## 2023-12-12 DIAGNOSIS — M180 Bilateral primary osteoarthritis of first carpometacarpal joints: Secondary | ICD-10-CM | POA: Diagnosis not present

## 2023-12-18 DIAGNOSIS — M79641 Pain in right hand: Secondary | ICD-10-CM | POA: Diagnosis not present

## 2023-12-18 DIAGNOSIS — R531 Weakness: Secondary | ICD-10-CM | POA: Diagnosis not present

## 2023-12-18 DIAGNOSIS — M25641 Stiffness of right hand, not elsewhere classified: Secondary | ICD-10-CM | POA: Diagnosis not present

## 2023-12-26 DIAGNOSIS — R531 Weakness: Secondary | ICD-10-CM | POA: Diagnosis not present

## 2023-12-26 DIAGNOSIS — M79641 Pain in right hand: Secondary | ICD-10-CM | POA: Diagnosis not present

## 2023-12-26 DIAGNOSIS — M25641 Stiffness of right hand, not elsewhere classified: Secondary | ICD-10-CM | POA: Diagnosis not present

## 2024-01-20 ENCOUNTER — Other Ambulatory Visit (HOSPITAL_BASED_OUTPATIENT_CLINIC_OR_DEPARTMENT_OTHER): Payer: Self-pay | Admitting: Physician Assistant

## 2024-01-20 ENCOUNTER — Telehealth (HOSPITAL_BASED_OUTPATIENT_CLINIC_OR_DEPARTMENT_OTHER): Payer: Self-pay

## 2024-01-20 DIAGNOSIS — N951 Menopausal and female climacteric states: Secondary | ICD-10-CM

## 2024-01-23 DIAGNOSIS — M180 Bilateral primary osteoarthritis of first carpometacarpal joints: Secondary | ICD-10-CM | POA: Diagnosis not present

## 2024-02-05 ENCOUNTER — Telehealth (HOSPITAL_BASED_OUTPATIENT_CLINIC_OR_DEPARTMENT_OTHER): Payer: Self-pay

## 2024-03-19 ENCOUNTER — Other Ambulatory Visit (HOSPITAL_BASED_OUTPATIENT_CLINIC_OR_DEPARTMENT_OTHER): Payer: Self-pay | Admitting: Physician Assistant

## 2024-03-19 DIAGNOSIS — E782 Mixed hyperlipidemia: Secondary | ICD-10-CM

## 2024-03-24 ENCOUNTER — Ambulatory Visit (HOSPITAL_BASED_OUTPATIENT_CLINIC_OR_DEPARTMENT_OTHER)
Admission: RE | Admit: 2024-03-24 | Discharge: 2024-03-24 | Disposition: A | Discharge status: Home / Self Care | Payer: Self-pay | Source: Ambulatory Visit | Attending: Physician Assistant | Admitting: Physician Assistant

## 2024-03-24 DIAGNOSIS — E782 Mixed hyperlipidemia: Secondary | ICD-10-CM | POA: Insufficient documentation

## 2024-03-26 ENCOUNTER — Other Ambulatory Visit (HOSPITAL_BASED_OUTPATIENT_CLINIC_OR_DEPARTMENT_OTHER)

## 2024-03-30 DIAGNOSIS — Z6825 Body mass index (BMI) 25.0-25.9, adult: Secondary | ICD-10-CM | POA: Diagnosis not present

## 2024-03-30 DIAGNOSIS — E782 Mixed hyperlipidemia: Secondary | ICD-10-CM | POA: Diagnosis not present

## 2024-03-30 DIAGNOSIS — R931 Abnormal findings on diagnostic imaging of heart and coronary circulation: Secondary | ICD-10-CM | POA: Diagnosis not present

## 2024-04-06 DIAGNOSIS — Z713 Dietary counseling and surveillance: Secondary | ICD-10-CM | POA: Diagnosis not present

## 2024-04-20 DIAGNOSIS — Z713 Dietary counseling and surveillance: Secondary | ICD-10-CM | POA: Diagnosis not present

## 2024-04-27 DIAGNOSIS — M1812 Unilateral primary osteoarthritis of first carpometacarpal joint, left hand: Secondary | ICD-10-CM | POA: Diagnosis not present

## 2024-05-07 DIAGNOSIS — M180 Bilateral primary osteoarthritis of first carpometacarpal joints: Secondary | ICD-10-CM | POA: Diagnosis not present

## 2024-05-27 ENCOUNTER — Ambulatory Visit: Attending: Cardiology | Admitting: Cardiovascular Disease

## 2024-05-27 ENCOUNTER — Encounter: Payer: Self-pay | Admitting: Cardiovascular Disease

## 2024-05-27 VITALS — BP 134/81 | HR 78 | Ht 65.0 in | Wt 158.8 lb

## 2024-05-27 DIAGNOSIS — Z8249 Family history of ischemic heart disease and other diseases of the circulatory system: Secondary | ICD-10-CM | POA: Diagnosis not present

## 2024-05-27 DIAGNOSIS — E785 Hyperlipidemia, unspecified: Secondary | ICD-10-CM | POA: Insufficient documentation

## 2024-05-27 DIAGNOSIS — M25641 Stiffness of right hand, not elsewhere classified: Secondary | ICD-10-CM | POA: Diagnosis not present

## 2024-05-27 DIAGNOSIS — R931 Abnormal findings on diagnostic imaging of heart and coronary circulation: Secondary | ICD-10-CM | POA: Diagnosis not present

## 2024-05-27 DIAGNOSIS — M79641 Pain in right hand: Secondary | ICD-10-CM | POA: Diagnosis not present

## 2024-05-27 DIAGNOSIS — E782 Mixed hyperlipidemia: Secondary | ICD-10-CM | POA: Diagnosis not present

## 2024-05-27 DIAGNOSIS — R531 Weakness: Secondary | ICD-10-CM | POA: Diagnosis not present

## 2024-05-27 MED ORDER — ASPIRIN 81 MG PO TBEC: 81.0000 mg | DELAYED_RELEASE_TABLET | Freq: Every day | ORAL | Status: AC

## 2024-05-27 NOTE — Patient Instructions (Signed)
 Medication Instructions:  Your physician has recommended you make the following change in your medication:   -Start aspirin EC 81mg  once daily.  *If you need a refill on your cardiac medications before your next appointment, please call your pharmacy*  Lab Work: Your physician recommends that you have labs drawn today: Lipid/liver panel  If you have labs (blood work) drawn today and your tests are completely normal, you will receive your results only by: MyChart Message (if you have MyChart) OR A paper copy in the mail If you have any lab test that is abnormal or we need to change your treatment, we will call you to review the results.   Follow-Up: At [facility], you and your health needs are our priority.  As part of our continuing mission to provide you with exceptional heart care, our providers are all part of one team.  This team includes your primary Cardiologist (physician) and Advanced Practice Providers or APPs (Physician Assistants and Nurse Practitioners) who all work together to provide you with the care you need, when you need it.  Your next appointment:   12 month(s)  Provider:   Dorn Lesches, MD    We recommend signing up for the patient portal called MyChart.  Sign up information is provided on this After Visit Summary.  MyChart is used to connect with patients for Virtual Visits (Telemedicine).  Patients are able to view lab/test results, encounter notes, upcoming appointments, etc.  Non-urgent messages can be sent to your provider as well.   To learn more about what you can do with MyChart, go to [email or web address].   Other Instructions Heart-Healthy Eating Plan Eating a healthy diet is important for the health of your heart. A heart-healthy eating plan includes: Eating less unhealthy fats. Eating more healthy fats. Eating less salt in your food. Salt is also called sodium. Making other changes in your diet. Talk with your doctor or a diet  specialist (dietitian) to create an eating plan that is right for you. Cooking Avoid frying your food. Try to bake, boil, grill, or broil it instead. You can also reduce fat by: Removing the skin from poultry. Removing all visible fats from meats. Steaming vegetables in water or broth. Meal planning  At meals, divide your plate into four equal parts: Fill one-half of your plate with vegetables and green salads. Fill one-fourth of your plate with whole grains. Fill one-fourth of your plate with lean protein foods. Eat 2-4 cups of vegetables per day. One cup of vegetables is: 1 cup (91 g) broccoli or cauliflower florets. 2 medium carrots. 1 large bell pepper. 1 large sweet potato. 1 large tomato. 1 medium white potato. 2 cups (150 g) raw leafy greens. Eat 1-2 cups of fruit per day. One cup of fruit is: 1 small apple 1 large banana 1 cup (237 g) mixed fruit, 1 large orange,  cup (82 g) dried fruit, 1 cup (240 mL) 100% fruit juice. Eat more foods that have soluble fiber. These are apples, broccoli, carrots, beans, peas, and barley. Try to get 20-30 g of fiber per day. Eat 4-5 servings of nuts, legumes, and seeds per week: 1 serving of dried beans or legumes equals  cup (90 g) cooked. 1 serving of nuts is  oz (12 almonds, 24 pistachios, or 7 walnut halves). 1 serving of seeds equals  oz (8 g). General information Eat more home-cooked food. Eat less restaurant, buffet, and fast food. Limit or avoid alcohol. Limit foods that are high  in starch and sugar. Avoid fried foods. Lose weight if you are overweight. Keep track of how much salt (sodium) you eat. This is important if you have high blood pressure. Ask your doctor to tell you more about this. Try to add vegetarian meals each week. Fats Choose healthy fats. These include olive oil and canola oil, flaxseeds, walnuts, almonds, and seeds. Eat more omega-3 fats. These include salmon, mackerel, sardines, tuna, flaxseed oil,  and ground flaxseeds. Try to eat fish at least 2 times each week. Check food labels. Avoid foods with trans fats or high amounts of saturated fat. Limit saturated fats. These are often found in animal products, such as meats, butter, and cream. These are also found in plant foods, such as palm oil, palm kernel oil, and coconut oil. Avoid foods with partially hydrogenated oils in them. These have trans fats. Examples are stick margarine, some tub margarines, cookies, crackers, and other baked goods. What foods should I eat? Fruits All fresh, canned (in natural juice), or frozen fruits. Vegetables Fresh or frozen vegetables (raw, steamed, roasted, or grilled). Green salads. Grains Most grains. Choose whole wheat and whole grains most of the time. Rice and pasta, including brown rice and pastas made with whole wheat. Meats and other proteins Lean, well-trimmed beef, veal, pork, and lamb. Chicken and turkey without skin. All fish and shellfish. Wild duck, rabbit, pheasant, and venison. Egg whites or low-cholesterol egg substitutes. Dried beans, peas, lentils, and tofu. Seeds and most nuts. Dairy Low-fat or nonfat cheeses, including ricotta and mozzarella. Skim or 1% milk that is liquid, powdered, or evaporated. Buttermilk that is made with low-fat milk. Nonfat or low-fat yogurt. Fats and oils Non-hydrogenated (trans-free) margarines. Vegetable oils, including soybean, sesame, sunflower, olive, peanut, safflower, corn, canola, and cottonseed. Salad dressings or mayonnaise made with a vegetable oil. Beverages Mineral water. Coffee and tea. Diet carbonated beverages. Sweets and desserts Sherbet, gelatin, and fruit ice. Small amounts of dark chocolate. Limit all sweets and desserts. Seasonings and condiments All seasonings and condiments. The items listed above may not be a complete list of foods and drinks you can eat. Contact a dietitian for more options. What foods should I  avoid? Fruits Canned fruit in heavy syrup. Fruit in cream or butter sauce. Fried fruit. Limit coconut. Vegetables Vegetables cooked in cheese, cream, or butter sauce. Fried vegetables. Grains Breads that are made with saturated or trans fats, oils, or whole milk. Croissants. Sweet rolls. Donuts. High-fat crackers, such as cheese crackers. Meats and other proteins Fatty meats, such as hot dogs, ribs, sausage, bacon, rib-eye roast or steak. High-fat deli meats, such as salami and bologna. Caviar. Domestic duck and goose. Organ meats, such as liver. Dairy Cream, sour cream, cream cheese, and creamed cottage cheese. Whole-milk cheeses. Whole or 2% milk that is liquid, evaporated, or condensed. Whole buttermilk. Cream sauce or high-fat cheese sauce. Yogurt that is made from whole milk. Fats and oils Meat fat, or shortening. Cocoa butter, hydrogenated oils, palm oil, coconut oil, palm kernel oil. Solid fats and shortenings, including bacon fat, salt pork, lard, and butter. Nondairy cream substitutes. Salad dressings with cheese or sour cream. Beverages Regular sodas and juice drinks with added sugar. Sweets and desserts Frosting. Pudding. Cookies. Cakes. Pies. Milk chocolate or white chocolate. Buttered syrups. Full-fat ice cream or ice cream drinks. The items listed above may not be a complete list of foods and drinks to avoid. Contact a dietitian for more information. Summary Heart-healthy meal planning includes eating less unhealthy  fats, eating more healthy fats, and making other changes in your diet. Eat a balanced diet. This includes fruits and vegetables, low-fat or nonfat dairy, lean protein, nuts and legumes, whole grains, and heart-healthy oils and fats. This information is not intended to replace advice given to you by your health care provider. Make sure you discuss any questions you have with your health care provider. Document Revised: 08/14/2021 Document Reviewed: 08/14/2021 Elsevier  Patient Education  2024 [employer].

## 2024-05-27 NOTE — Progress Notes (Signed)
 05/27/2024 Karen Navarro   Jul 20, 1966  [id number]  [facility]: Dorn JINNY Lesches MD [address], [place], [place]  HPI:  Karen Navarro is a 58 y.o. mildly overweight divorced Caucasian female mother of 3 children, grandmother of 5 and 2 to be 6 grandchildren who was referred by her primary care provider, Mliss Chill , PA-C, for cardiac evaluation because of an elevated coronary calcium score.  She works in an training and development officer at [employer].  Risk factors include hyperlipidemia as well as family history.  Father had CABG at age 77 and brother had MI at age 19.  She is never had a heart attack or stroke.  She denies chest pain or shortness of breath.  She admits to be fairly sedentary but does walk some without symptoms.  She had a total abdominal hysterectomy and new oophorectomy for cervical dysplasia 11/23.  A coronary calcium score performed 03/24/2024 with was 173 distributed in all 3 coronaries.   Current Meds  Medication Sig   erythromycin with ethanol (THERAMYCIN) 2 % external solution Apply 1 Application topically daily as needed (acne).   meclizine  (ANTIVERT ) 25 MG tablet Take 1 tablet (25 mg total) by mouth 3 (three) times daily as needed for dizziness.   Multiple Vitamin tablet Take 1 tablet by mouth daily. 50 +   Omega-3 Fatty Acids (FISH OIL) 1200 MG CAPS Take 1,200 mg by mouth daily.   rosuvastatin (CRESTOR) 20 MG tablet Take 20 mg by mouth daily.   valACYclovir (VALTREX) 500 MG tablet Take 500 mg by mouth daily as needed (Breakout).     Allergies  Allergen Reactions   Sulfa Antibiotics Swelling    Social History   Socioeconomic History   Marital status: Single    Spouse name: Not on file   Number of children: 3   Years of education: College   Highest education level: Not on file  Occupational History   Occupation: Investment Banker, [employer]: [employer]  Tobacco Use   Smoking status:  Never   Smokeless tobacco: Never  Vaping Use   Vaping status: Never Used  Substance and Sexual Activity   Alcohol use: Yes    Alcohol/week: 8.0 standard drinks of alcohol    Types: 8 Cans of beer per week    Comment: weekends   Drug use: No   Sexual activity: Not on file  Other Topics Concern   Not on file  Social History Narrative   Not on file   Social Drivers of Health   Financial Resource Strain: Not on file  Food Insecurity: Low Risk  (05/14/2023)   Received from [facility]   Hunger Vital Sign    Within the past 12 months, you worried that your food would run out before you got money to buy more: Never true    Within the past 12 months, the food you bought just didn't last and you didn't have money to get more. : Never true  Transportation Needs: No Transportation Needs (05/14/2023)   Received from [facility]    In the past 12 months, has lack of reliable transportation kept you from medical appointments, meetings, work or from getting things needed for daily living? : No  Physical Activity: Not on file  Stress: Not on file  Social Connections: Not on file  Intimate Partner Violence: Not on file     Review of Systems: General: negative  for chills, fever, night sweats or weight changes.  Cardiovascular: negative for chest pain, dyspnea on exertion, edema, orthopnea, palpitations, paroxysmal nocturnal dyspnea or shortness of breath Dermatological: negative for rash Respiratory: negative for cough or wheezing Urologic: negative for hematuria Abdominal: negative for nausea, vomiting, diarrhea, bright red blood per rectum, melena, or hematemesis Neurologic: negative for visual changes, syncope, or dizziness All other systems reviewed and are otherwise negative except as noted above.    Blood pressure 134/81, pulse 78, height 5' 5 (1.651 m), weight 158 lb 12.8 oz (72 kg), last menstrual period 09/21/2016, SpO2 99%.  General appearance: alert and  no distress Neck: no adenopathy, no carotid bruit, no JVD, supple, symmetrical, trachea midline, and thyroid  not enlarged, symmetric, no tenderness/mass/nodules Lungs: clear to auscultation bilaterally Heart: regular rate and rhythm, S1, S2 normal, no murmur, click, rub or gallop Extremities: extremities normal, atraumatic, no cyanosis or edema Pulses: 2+ and symmetric Skin: Skin color, texture, turgor normal. No rashes or lesions Neurologic: Grossly normal  EKG EKG Interpretation Date/Time:  Wednesday May 27 2024 09:17:45 EST Ventricular Rate:  78 PR Interval:  150 QRS Duration:  86 QT Interval:  366 QTC Calculation: 417 R Axis:   57  Text Interpretation: Normal sinus rhythm Normal ECG When compared with ECG of 09-Dec-2021 15:13, No significant change was found Confirmed by Court Carrier [phone]) on 05/27/2024 9:44:13 AM    ASSESSMENT AND PLAN:   Family history of heart disease Father had CABG at age 45, brother had an MI at age 62.  Elevated coronary artery calcium score Recent coronary calcium score ordered by her primary care provider 03/24/2024 was 173 distributed in all 3 coronary arteries.  The patient is completely asymptomatic but not at goal for secondary prevention.  I am going to start her on a baby aspirin.  Hyperlipidemia History of hyperlipidemia recently started on rosuvastatin 20 mg a day by her PCP 2 months ago.  Her most recent lipid profile performed 12/06/2023 revealing total cholesterol 233, LDL 145 and HDL of 68, LDL goal less than 70.  We will check a fasting lipid profile today.  I am going to provide her with a heart healthy diet as well.     Carrier DOROTHA Court MD FACP,FACC,FAHA, [facility] 05/27/2024 10:01 AM

## 2024-05-27 NOTE — Assessment & Plan Note (Signed)
 Recent coronary calcium score ordered by her primary care provider 03/24/2024 was 173 distributed in all 3 coronary arteries.  The patient is completely asymptomatic but not at goal for secondary prevention.  I am going to start her on a baby aspirin.

## 2024-05-27 NOTE — Assessment & Plan Note (Signed)
 History of hyperlipidemia recently started on rosuvastatin 20 mg a day by her PCP 2 months ago.  Her most recent lipid profile performed 12/06/2023 revealing total cholesterol 233, LDL 145 and HDL of 68, LDL goal less than 70.  We will check a fasting lipid profile today.  I am going to provide her with a heart healthy diet as well.

## 2024-05-27 NOTE — Assessment & Plan Note (Signed)
 Father had CABG at age 60, brother had an MI at age 21.

## 2024-05-28 ENCOUNTER — Ambulatory Visit: Payer: Self-pay | Admitting: Cardiovascular Disease

## 2024-05-28 DIAGNOSIS — E782 Mixed hyperlipidemia: Secondary | ICD-10-CM

## 2024-05-28 DIAGNOSIS — R931 Abnormal findings on diagnostic imaging of heart and coronary circulation: Secondary | ICD-10-CM

## 2024-05-28 LAB — LIPID PANEL
Chol/HDL Ratio: 1.9 ratio (ref 0.0–4.4)
Cholesterol, Total: 131 mg/dL (ref 100–199)
HDL: 70 mg/dL (ref >39)
LDL Chol Calc (NIH): 46 mg/dL (ref 0–99)
Triglycerides: 79 mg/dL (ref 0–149)
VLDL Cholesterol Cal: 15 mg/dL (ref 5–40)

## 2024-05-28 LAB — HEPATIC FUNCTION PANEL
ALT: 29 IU/L (ref 0–32)
AST: 33 IU/L (ref 0–40)
Albumin: 4.6 g/dL (ref 3.8–4.9)
Alkaline Phosphatase: 146 IU/L — ABNORMAL HIGH (ref 49–135)
Bilirubin Total: 0.3 mg/dL (ref 0.0–1.2)
Bilirubin, Direct: 0.13 mg/dL (ref 0.00–0.40)
Total Protein: 7.4 g/dL (ref 6.0–8.5)

## 2024-06-03 DIAGNOSIS — M25641 Stiffness of right hand, not elsewhere classified: Secondary | ICD-10-CM | POA: Diagnosis not present

## 2024-06-03 DIAGNOSIS — R531 Weakness: Secondary | ICD-10-CM | POA: Diagnosis not present

## 2024-06-03 DIAGNOSIS — M79641 Pain in right hand: Secondary | ICD-10-CM | POA: Diagnosis not present

## 2024-06-10 DIAGNOSIS — M79641 Pain in right hand: Secondary | ICD-10-CM | POA: Diagnosis not present

## 2024-06-10 DIAGNOSIS — R531 Weakness: Secondary | ICD-10-CM | POA: Diagnosis not present

## 2024-06-10 DIAGNOSIS — M25641 Stiffness of right hand, not elsewhere classified: Secondary | ICD-10-CM | POA: Diagnosis not present

## 2024-06-25 DIAGNOSIS — M25641 Stiffness of right hand, not elsewhere classified: Secondary | ICD-10-CM | POA: Diagnosis not present

## 2024-06-25 DIAGNOSIS — M180 Bilateral primary osteoarthritis of first carpometacarpal joints: Secondary | ICD-10-CM | POA: Diagnosis not present

## 2024-06-25 DIAGNOSIS — M79641 Pain in right hand: Secondary | ICD-10-CM | POA: Diagnosis not present

## 2024-06-25 DIAGNOSIS — R531 Weakness: Secondary | ICD-10-CM | POA: Diagnosis not present

## 2024-07-28 ENCOUNTER — Ambulatory Visit (HOSPITAL_BASED_OUTPATIENT_CLINIC_OR_DEPARTMENT_OTHER)
Admission: RE | Admit: 2024-07-28 | Discharge: 2024-07-28 | Disposition: A | Discharge status: Home / Self Care | Source: Ambulatory Visit | Attending: Physician Assistant | Admitting: Physician Assistant

## 2024-07-28 DIAGNOSIS — N951 Menopausal and female climacteric states: Secondary | ICD-10-CM | POA: Insufficient documentation

## 2024-08-28 ENCOUNTER — Other Ambulatory Visit: Payer: Self-pay | Admitting: Nurse Practitioner

## 2024-08-28 DIAGNOSIS — Z1231 Encounter for screening mammogram for malignant neoplasm of breast: Secondary | ICD-10-CM

## 2024-10-12 ENCOUNTER — Ambulatory Visit
